# Patient Record
Sex: Female | Born: 1975 | Race: White | Hispanic: No | Marital: Married | Smoking: Current every day smoker
Health system: Southern US, Community
[De-identification: ages and names within clinical notes are randomized; demographics above are authoritative.]

## PROBLEM LIST (undated history)

## (undated) DIAGNOSIS — K219 Gastro-esophageal reflux disease without esophagitis: Secondary | ICD-10-CM

## (undated) DIAGNOSIS — E119 Type 2 diabetes mellitus without complications: Secondary | ICD-10-CM

## (undated) DIAGNOSIS — R351 Nocturia: Secondary | ICD-10-CM

## (undated) DIAGNOSIS — Z803 Family history of malignant neoplasm of breast: Secondary | ICD-10-CM

## (undated) DIAGNOSIS — R350 Frequency of micturition: Secondary | ICD-10-CM

## (undated) DIAGNOSIS — Z8041 Family history of malignant neoplasm of ovary: Secondary | ICD-10-CM

## (undated) DIAGNOSIS — R102 Pelvic and perineal pain: Secondary | ICD-10-CM

## (undated) DIAGNOSIS — C439 Malignant melanoma of skin, unspecified: Secondary | ICD-10-CM

## (undated) DIAGNOSIS — R3915 Urgency of urination: Secondary | ICD-10-CM

## (undated) DIAGNOSIS — Z8639 Personal history of other endocrine, nutritional and metabolic disease: Secondary | ICD-10-CM

## (undated) DIAGNOSIS — R5383 Other fatigue: Secondary | ICD-10-CM

## (undated) HISTORY — DX: Other fatigue: R53.83

## (undated) HISTORY — DX: Gastro-esophageal reflux disease without esophagitis: K21.9

## (undated) HISTORY — DX: Family history of malignant neoplasm of ovary: Z80.41

## (undated) HISTORY — DX: Malignant melanoma of skin, unspecified: C43.9

## (undated) HISTORY — DX: Type 2 diabetes mellitus without complications: E11.9

## (undated) HISTORY — DX: Family history of malignant neoplasm of breast: Z80.3

---

## 1996-02-24 HISTORY — PX: WISDOM TOOTH EXTRACTION: SHX21

## 1999-02-24 HISTORY — PX: KNEE SURGERY: SHX244

## 2002-02-23 HISTORY — PX: PELVIC LAPAROSCOPY: SHX162

## 2003-12-05 ENCOUNTER — Inpatient Hospital Stay: Payer: Self-pay

## 2006-07-16 ENCOUNTER — Ambulatory Visit: Payer: Self-pay | Admitting: Gastroenterology

## 2006-12-28 ENCOUNTER — Ambulatory Visit: Payer: Self-pay

## 2006-12-28 IMAGING — US ULTRASOUND LEFT BREAST
1 series · 13 of 13 positions shown · non-contrast
Comparison: none

REASON FOR EXAM: left fibrocystic tissue upper outer quad at 1 to 3
oclock location
COMMENTS:

PROCEDURE:     US  - US BREAST LEFT  - [DATE]  [DATE]
RESULT:     Limited sonographic evaluation of the LEFT breast is performed
from the 1 o'clock to the 3 o'clock position. There is no definite solid or
cystic abnormality evident sonographically.

[Series 1: ultrasound left breast · 13 of 13 slices shown]
[im 1/13]
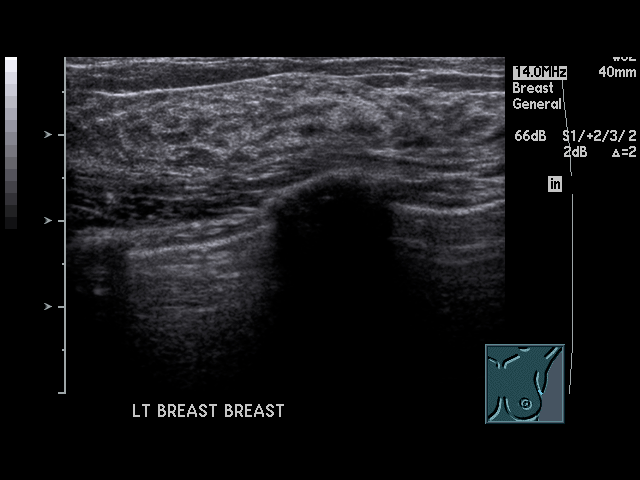
[im 2/13]
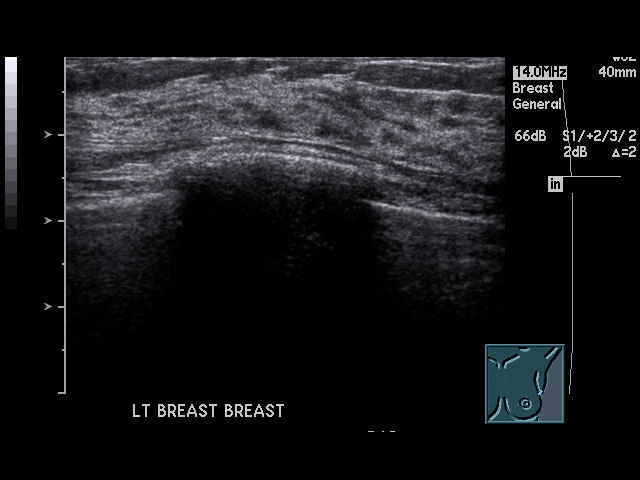
[im 3/13]
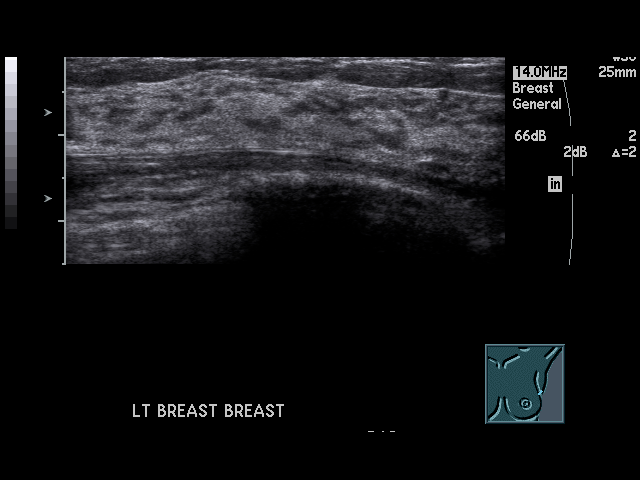
[im 4/13]
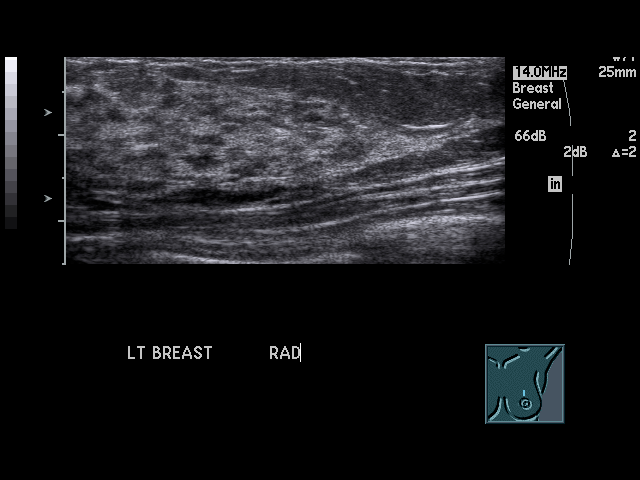
[im 5/13]
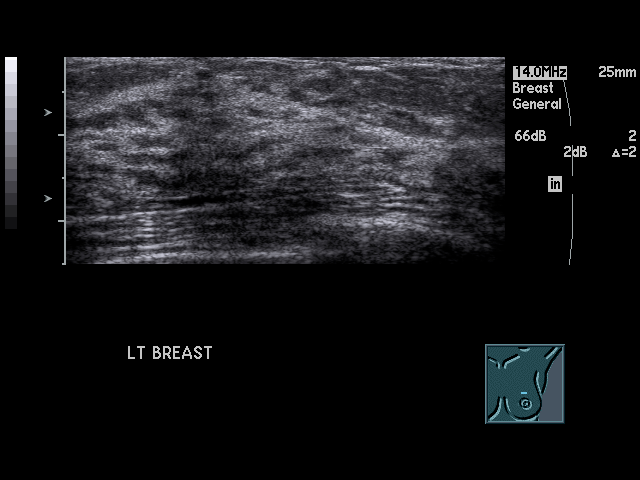
[im 6/13]
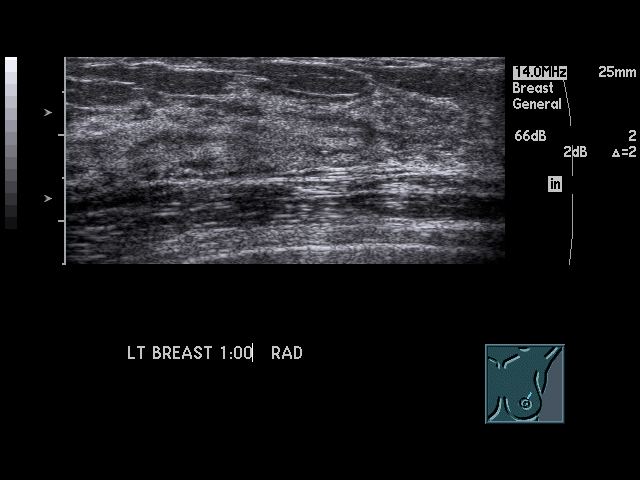
[im 7/13]
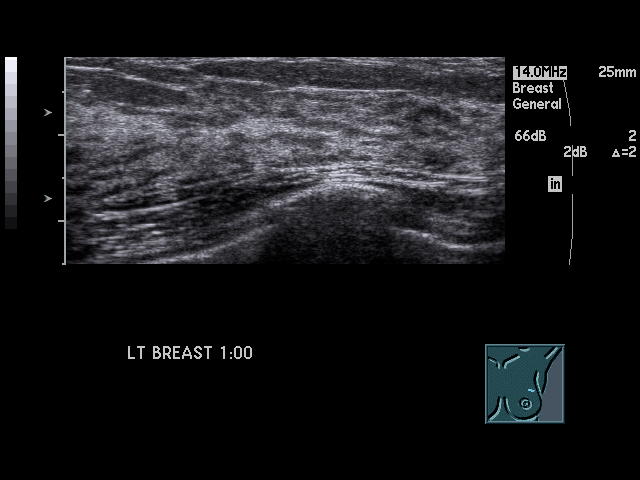
[im 8/13]
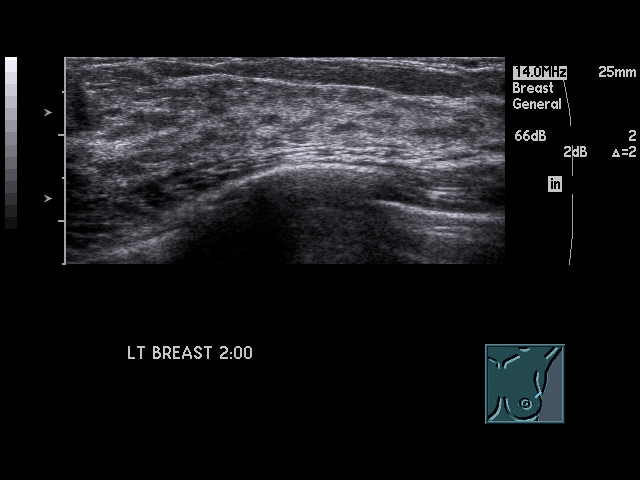
[im 9/13]
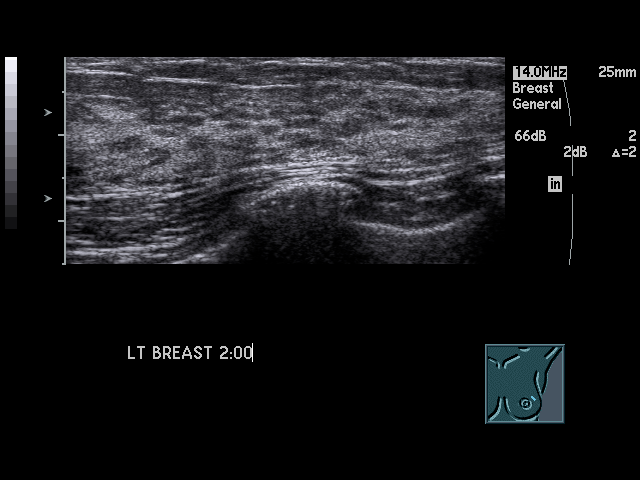
[im 10/13]
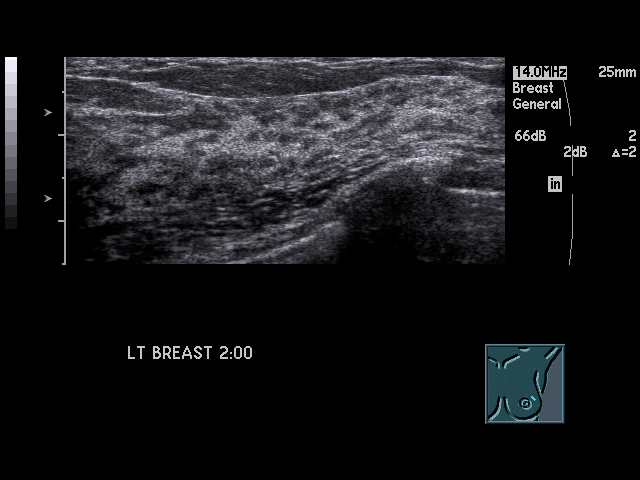
[im 11/13]
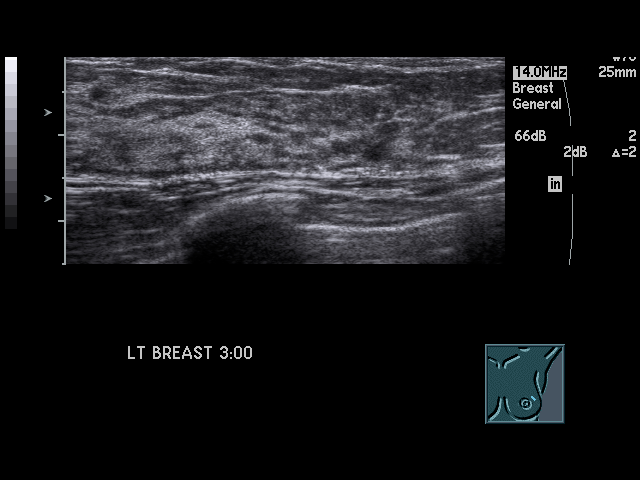
[im 12/13]
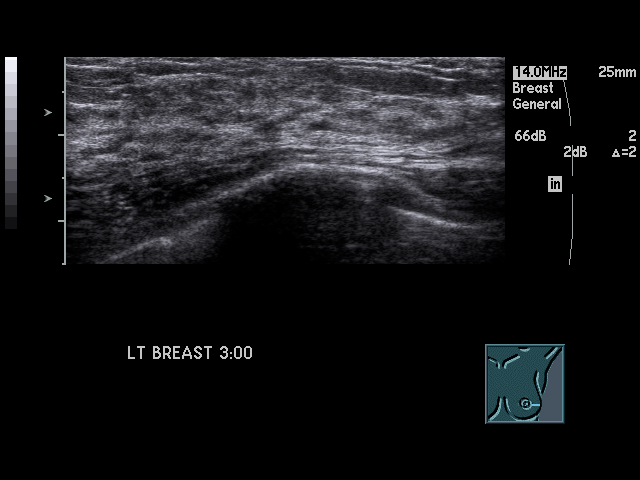
[im 13/13]
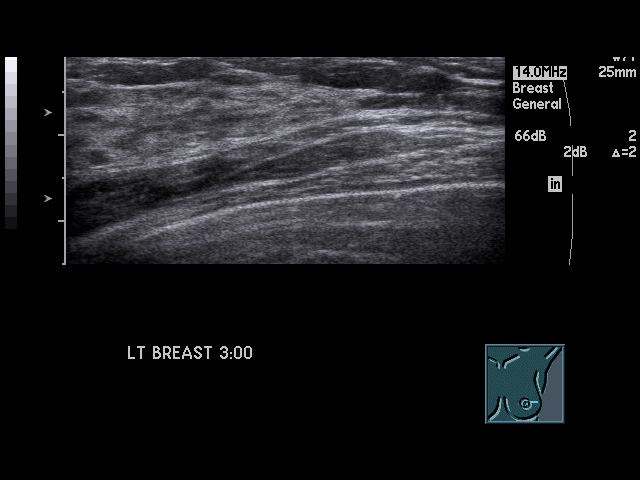

[13 of 13 positions shown; findings below may reference images not displayed]

IMPRESSION: Negative limited LEFT breast ultrasound. If the patient has
clinically worrisome findings then mammography would be recommended.

BI-RADS: Category 2 - Benign Finding.

## 2007-03-09 ENCOUNTER — Ambulatory Visit: Payer: Self-pay

## 2008-02-24 HISTORY — PX: CHOLECYSTECTOMY: SHX55

## 2008-03-12 ENCOUNTER — Emergency Department: Payer: Self-pay | Admitting: Emergency Medicine

## 2008-03-19 ENCOUNTER — Ambulatory Visit: Payer: Self-pay | Admitting: Gastroenterology

## 2008-04-06 ENCOUNTER — Ambulatory Visit: Payer: Self-pay | Admitting: Surgery

## 2008-11-07 ENCOUNTER — Ambulatory Visit: Payer: Self-pay | Admitting: Obstetrics & Gynecology

## 2008-12-13 ENCOUNTER — Ambulatory Visit: Payer: Self-pay | Admitting: Surgery

## 2008-12-27 ENCOUNTER — Ambulatory Visit: Payer: Self-pay | Admitting: Obstetrics & Gynecology

## 2009-03-21 ENCOUNTER — Ambulatory Visit: Payer: Self-pay | Admitting: Obstetrics & Gynecology

## 2009-06-10 ENCOUNTER — Ambulatory Visit: Payer: Self-pay | Admitting: Obstetrics and Gynecology

## 2009-07-11 ENCOUNTER — Encounter: Payer: Self-pay | Admitting: Obstetrics & Gynecology

## 2009-07-11 ENCOUNTER — Ambulatory Visit: Payer: Self-pay | Admitting: Obstetrics & Gynecology

## 2009-07-11 LAB — CONVERTED CEMR LAB
Glucose, Bld: 58 mg/dL — ABNORMAL LOW (ref 70–99)
Platelets: 351 10*3/uL (ref 150–400)
RDW: 13.9 % (ref 11.5–15.5)
TSH: 1.12 microintl units/mL (ref 0.350–4.500)

## 2009-09-09 ENCOUNTER — Ambulatory Visit: Payer: Self-pay | Admitting: Obstetrics and Gynecology

## 2009-11-22 ENCOUNTER — Ambulatory Visit: Payer: Self-pay | Admitting: Obstetrics & Gynecology

## 2010-02-13 ENCOUNTER — Ambulatory Visit: Payer: Self-pay | Admitting: Obstetrics & Gynecology

## 2010-04-07 ENCOUNTER — Ambulatory Visit: Payer: Self-pay | Admitting: Obstetrics & Gynecology

## 2010-05-05 ENCOUNTER — Ambulatory Visit (INDEPENDENT_AMBULATORY_CARE_PROVIDER_SITE_OTHER): Payer: BC Managed Care – PPO

## 2010-05-05 ENCOUNTER — Ambulatory Visit: Payer: BC Managed Care – PPO | Admitting: Obstetrics & Gynecology

## 2010-05-05 DIAGNOSIS — Z3049 Encounter for surveillance of other contraceptives: Secondary | ICD-10-CM

## 2010-05-05 DIAGNOSIS — N9489 Other specified conditions associated with female genital organs and menstrual cycle: Secondary | ICD-10-CM

## 2010-05-06 ENCOUNTER — Encounter: Payer: Self-pay | Admitting: Obstetrics and Gynecology

## 2010-05-16 NOTE — Assessment & Plan Note (Unsigned)
NAME:  [name], [name]              ACCOUNT NO.:  [id number]  MEDICAL RECORD NO.:  [id number]           PATIENT TYPE:  LOCATION:  [facility] at [facility]           FACILITY:  PHYSICIAN:  Argentina Donovan, MD        DATE OF BIRTH:  [birth year]  DATE OF SERVICE:  05/05/2010                                 CLINIC NOTE  The patient is a 35 year old married white female, gravida 2, para 1-0-1- 1 who was in for her annual exam in May of last year.  She comes in today because she has pain in her left breast and she is complaining of some sores in the vaginal area. On examination, her breasts are symmetrical.  No dominant masses.  No nipple discharge.  No supraclavicular or axillary nodes.  However, there is a very sensitive area intercostally in the left lateral flank area just under her axilla, but she mentions that she has to very often take off her bra.  I think that this is possibly is due to trauma.  I would suggest that she use a wider band bra such as an athletic bra and apply capsaicin HP ointment or cream as well as using ibuprofen and see if that takes care of it. On examination of the genital area, she has some small ulcerations just above the clitoris, but she states that they have been there for 2 months which does not sound like herpes, although this is what they look like.  They look like herpetic ulcers and also culture was taken.  I am going to put her on Valtrex to see whether that takes care of it.  I am not exactly sure what this is from.  It is in the hair-bearing area and they look like little fissures.  I am not sure what the etiology is at this point.  I am going to have her come back in a couple of weeks after taking antiviral medication and see if there is any improvement.  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          ______________________________ Argentina Donovan, MD    PR/MEDQ  D:  05/05/2010  T:  05/06/2010  Job:  [id number]

## 2010-05-19 ENCOUNTER — Ambulatory Visit: Payer: BC Managed Care – PPO | Admitting: Obstetrics & Gynecology

## 2010-05-22 ENCOUNTER — Other Ambulatory Visit: Payer: Self-pay | Admitting: Obstetrics & Gynecology

## 2010-05-22 ENCOUNTER — Ambulatory Visit (INDEPENDENT_AMBULATORY_CARE_PROVIDER_SITE_OTHER): Payer: BC Managed Care – PPO | Admitting: Obstetrics & Gynecology

## 2010-05-22 DIAGNOSIS — N644 Mastodynia: Secondary | ICD-10-CM

## 2010-05-22 DIAGNOSIS — D4959 Neoplasm of unspecified behavior of other genitourinary organ: Secondary | ICD-10-CM

## 2010-05-23 ENCOUNTER — Other Ambulatory Visit: Payer: Self-pay | Admitting: Obstetrics & Gynecology

## 2010-05-23 ENCOUNTER — Ambulatory Visit
Admission: RE | Admit: 2010-05-23 | Discharge: 2010-05-23 | Disposition: A | Discharge status: Home / Self Care | Payer: BC Managed Care – PPO | Source: Ambulatory Visit | Attending: Obstetrics & Gynecology | Admitting: Obstetrics & Gynecology

## 2010-05-23 DIAGNOSIS — N63 Unspecified lump in unspecified breast: Secondary | ICD-10-CM

## 2010-05-23 DIAGNOSIS — N644 Mastodynia: Secondary | ICD-10-CM

## 2010-06-09 ENCOUNTER — Ambulatory Visit: Payer: BC Managed Care – PPO | Admitting: Obstetrics & Gynecology
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
## 2010-07-08 NOTE — Assessment & Plan Note (Signed)
NAME:  [name], [name]              ACCOUNT NO.:  [id number]   MEDICAL RECORD NO.:  [id number]          PATIENT TYPE:  POB   LOCATION:  [facility]         FACILITY:  [facility]   PHYSICIAN:  Allie Bossier, MD        DATE OF BIRTH:  07-Apr-1975   DATE OF SERVICE:  07/11/2009                                  CLINIC NOTE   HISTORY:  Galina is a 35 year old married white, gravida 2, para 1,  abortus 1, and she has a 52-year-old daughter.  She comes in here for  annual exam.  She reports that she thinks that she might have had  another ovarian cyst on her left.  She had left-sided pain last week, it  has improved over the last several days as she has been taking  ibuprofen.  She says now it is dull ache.   PAST MEDICAL HISTORY:  Recurrent ovarian cyst, history of PID,  Trichomonas, and herpes, history of breast cysts and GERD.   REVIEW OF SYSTEMS:  She had been married for 8 years.  She works at  [employer].  She denies dyspareunia.  She rarely has periods, on  the Depo-Provera.  Of note, she does complain of 20-pound weight gain in  the last year.   MEDICATIONS:  She takes Depo-Provera every 3 months, takes olive leaves  extract and ibuprofen as necessary and Gas-X as necessary.   PAST SURGICAL HISTORY:  She had a cholecystectomy in October 2010.  Laparoscopy at age 29 with a left ovarian cystectomy.  She had left  arthroscopy.  She has had endoscopy with biopsies in 2008.   ALLERGIES:  No latex allergies.  No known drug allergies.   FAMILY HISTORY:  Significant for cervical, ovarian melanoma, and bladder  cancer in her mother and a paternal aunt who had breast cancer.   PHYSICAL EXAMINATION:  VITAL SIGNS:  Height 5 feet 2 inches, weight 127  pounds, blood pressure 111/72, and pulse 94.  HEENT:  Normal.  HEART:  Regular rate and rhythm.  LUNGS:  Clear to auscultation bilaterally.  BREASTS:  Normal.  ABDOMEN:  No palpable hepatosplenomegaly.  EXTERNAL GENITALIA:  No  lesions.  Cervix, normal.  Uterus normal size  and shape, midplane, mobile.  Adnexa, nontender.  No masses.   ASSESSMENT AND PLAN:  1. Annual exam, checked Pap smear with cultures.  Recommended self-      breast and self-vulvar exams.  2. Left lower quadrant discomfort.  I offered her an ultrasound, but      told her that since her pain is getting better, perhaps the      ultrasound would be over until she agrees with this, but I have      told her that should her pain return we will get a GYN ultrasound.  3. Weight gain maybe due to Depo-Provera, but she also complains of      constipation and dry skins and I am checking a TSH.  4. She says she has a history of low blood sugar and she would like      her lipids and sugar checked today, so this will be done.  Allie Bossier, MD     MCD/MEDQ  D:  07/11/2009  T:  07/12/2009  Job:  [id number]
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
## 2010-07-17 ENCOUNTER — Ambulatory Visit (INDEPENDENT_AMBULATORY_CARE_PROVIDER_SITE_OTHER): Payer: BC Managed Care – PPO | Admitting: Obstetrics & Gynecology

## 2010-07-17 DIAGNOSIS — N766 Ulceration of vulva: Secondary | ICD-10-CM

## 2010-07-17 DIAGNOSIS — Z113 Encounter for screening for infections with a predominantly sexual mode of transmission: Secondary | ICD-10-CM

## 2010-07-17 DIAGNOSIS — R5381 Other malaise: Secondary | ICD-10-CM

## 2010-07-17 DIAGNOSIS — Z1272 Encounter for screening for malignant neoplasm of vagina: Secondary | ICD-10-CM

## 2010-07-17 DIAGNOSIS — E162 Hypoglycemia, unspecified: Secondary | ICD-10-CM

## 2010-07-17 DIAGNOSIS — R350 Frequency of micturition: Secondary | ICD-10-CM

## 2010-07-17 DIAGNOSIS — Z01419 Encounter for gynecological examination (general) (routine) without abnormal findings: Secondary | ICD-10-CM
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
## 2010-08-04 ENCOUNTER — Ambulatory Visit: Payer: BC Managed Care – PPO

## 2010-08-05 ENCOUNTER — Ambulatory Visit: Payer: BC Managed Care – PPO

## 2010-08-07 ENCOUNTER — Ambulatory Visit (INDEPENDENT_AMBULATORY_CARE_PROVIDER_SITE_OTHER): Payer: BC Managed Care – PPO | Admitting: Obstetrics & Gynecology

## 2010-08-07 ENCOUNTER — Ambulatory Visit: Payer: BC Managed Care – PPO

## 2010-08-07 DIAGNOSIS — Z3049 Encounter for surveillance of other contraceptives: Secondary | ICD-10-CM

## 2010-09-04 NOTE — Assessment & Plan Note (Signed)
NAME:  [name], [name]              ACCOUNT NO.:  [id number]  MEDICAL RECORD NO.:  [id number]          PATIENT TYPE:  POB  LOCATION:  [facility]         FACILITY:  [facility]  PHYSICIAN:  Elsie Lincoln, MD      DATE OF BIRTH:  Sep 12, 1975  DATE OF SERVICE:  05/22/2010                                 CLINIC NOTE  The patient is a 35 year old female who presents for multiple reasons. 1. The patient has left breast pain.  She has a history of breast     cancer and is very worried.  The left breast is bigger than the     right breast and it has not gone away for several weeks.  She     changed bras and taken ibuprofen, it was not helping.  She has had     breast cancer in her aunt and again that she is very worried, so I     suggest [facility] and if they     need to do any other diagnostic workup, I will leave up to them. 2. The patient is complaining of periodic pustules coming up in her     perineum.  Upon inspection, these look like MRSA, but one of them     is coming up and once the top of the ulceration was removed, it     does not necessary look like a pustule with pus, it looks more     ulcerative, so I am not quite sure if this is MRSA or bacterial.     It could be a viral or another type of dermatological problem.     These have been coming up periodically for a long time.  I cultured     the blister that I unroofed with a bacterial culture and we will     treat with Bactrim double strength 1 tablet p.o. for 10 days.  The     patient had a negative herpes viral culture and these do not really     look like herpes sores.  Negative herpes viral culture was just     last week.  There is a separate area at the top of her mons just     above her clitoris that has been bothering her for 2 months, it     scabs and weeps and is painful.  This is the area that was negative     for herpes.  Currently, it is crusted over with a yellowish crust.     It  is very red and indurated.  This looks different than the 2     areas that was more pustule like.  I am worried this could be a     precancerous lesion given that it is there for 2 months and it will     not heal.  The patient was consented for biopsy.  This area was     cleaned with Betadine, infiltrated with 1% lidocaine, and a Keyes     punch biopsy was obtained without issue.  Hemostasis was controlled     with silver nitrate.  ASSESSMENT AND PLAN:  A 35 year old female with  multiple problems: 1. Breast issues, will come back, will get a breast ultrasound and     followup accordingly. 2. Bactrim for pustules in the hairline. 3. Biopsy of the area at 12 o'clock on the mons just above the     clitoris.  This area seems different than the other pustule. 4. The patient to come back in 2 weeks to see how she is doing.  I     will not be here and the patient has seen Dr. Marice Potter in the past and     was satisfied to see her.          ______________________________ Elsie Lincoln, MD    KL/MEDQ  D:  05/22/2010  T:  05/23/2010  Job:  [id number]

## 2010-10-30 ENCOUNTER — Ambulatory Visit: Payer: BC Managed Care – PPO

## 2010-11-03 ENCOUNTER — Ambulatory Visit (INDEPENDENT_AMBULATORY_CARE_PROVIDER_SITE_OTHER): Payer: BC Managed Care – PPO | Admitting: *Deleted

## 2010-11-03 DIAGNOSIS — Z3049 Encounter for surveillance of other contraceptives: Secondary | ICD-10-CM

## 2010-11-03 DIAGNOSIS — Z3042 Encounter for surveillance of injectable contraceptive: Secondary | ICD-10-CM

## 2010-11-03 MED ORDER — MEDROXYPROGESTERONE ACETATE 150 MG/ML IM SUSP
150.0000 mg | Freq: Once | INTRAMUSCULAR | Status: AC
  Administered 2010-11-03: 150 mg via INTRAMUSCULAR

## 2010-11-03 NOTE — Progress Notes (Signed)
Patient is here for Depo Provera 150mg .  She will follow up in 3 months for Next injection (December 5)

## 2010-12-04 ENCOUNTER — Telehealth: Payer: Self-pay | Admitting: *Deleted

## 2010-12-04 DIAGNOSIS — B373 Candidiasis of vulva and vagina: Secondary | ICD-10-CM

## 2010-12-04 MED ORDER — FLUCONAZOLE 150 MG PO TABS: 150.0000 mg | ORAL_TABLET | Freq: Once | ORAL | Status: AC

## 2010-12-04 NOTE — Telephone Encounter (Signed)
Patient has a yeast infection and would like Diflucan called in for her.

## 2011-01-28 ENCOUNTER — Other Ambulatory Visit (INDEPENDENT_AMBULATORY_CARE_PROVIDER_SITE_OTHER): Payer: BC Managed Care – PPO | Admitting: *Deleted

## 2011-01-28 DIAGNOSIS — Z3049 Encounter for surveillance of other contraceptives: Secondary | ICD-10-CM

## 2011-01-28 DIAGNOSIS — Z3042 Encounter for surveillance of injectable contraceptive: Secondary | ICD-10-CM

## 2011-01-28 MED ORDER — MEDROXYPROGESTERONE ACETATE 150 MG/ML IM SUSP
150.0000 mg | Freq: Once | INTRAMUSCULAR | Status: AC
  Administered 2011-01-28: 150 mg via INTRAMUSCULAR

## 2011-04-08 ENCOUNTER — Encounter: Payer: Self-pay | Admitting: Cardiothoracic Surgery

## 2011-04-08 ENCOUNTER — Encounter: Payer: Self-pay | Admitting: Nurse Practitioner

## 2011-04-23 ENCOUNTER — Ambulatory Visit (INDEPENDENT_AMBULATORY_CARE_PROVIDER_SITE_OTHER): Payer: BC Managed Care – PPO | Admitting: *Deleted

## 2011-04-23 DIAGNOSIS — Z3049 Encounter for surveillance of other contraceptives: Secondary | ICD-10-CM

## 2011-04-23 MED ORDER — MEDROXYPROGESTERONE ACETATE 150 MG/ML IM SUSP
150.0000 mg | INTRAMUSCULAR | Status: DC
  Administered 2011-04-23 – 2011-10-13 (×3): 150 mg via INTRAMUSCULAR

## 2011-04-23 NOTE — Progress Notes (Signed)
Patient is here for Depo Provera.  Her last injection was given in December and the computers were down so this is why there is no documentation.  She will return in April for her physical  Exam.

## 2011-04-24 ENCOUNTER — Encounter: Payer: Self-pay | Admitting: Cardiothoracic Surgery

## 2011-04-24 ENCOUNTER — Encounter: Payer: Self-pay | Admitting: Nurse Practitioner

## 2011-06-18 ENCOUNTER — Ambulatory Visit: Payer: BC Managed Care – PPO | Admitting: Family Medicine

## 2011-06-24 ENCOUNTER — Telehealth: Payer: Self-pay | Admitting: *Deleted

## 2011-06-24 MED ORDER — FLUCONAZOLE 150 MG PO TABS: 150.0000 mg | ORAL_TABLET | Freq: Once | ORAL | Status: AC

## 2011-06-24 NOTE — Telephone Encounter (Signed)
Patient is having symptoms of a yeast infection and would like to have diflucan called in.  She will call for appointment if she is not feeling better.

## 2011-07-07 ENCOUNTER — Encounter: Payer: Self-pay | Admitting: Family Medicine

## 2011-07-07 ENCOUNTER — Ambulatory Visit (INDEPENDENT_AMBULATORY_CARE_PROVIDER_SITE_OTHER): Payer: BC Managed Care – PPO | Admitting: Family Medicine

## 2011-07-07 VITALS — BP 126/83 | HR 104 | Ht 62.0 in | Wt 140.0 lb

## 2011-07-07 DIAGNOSIS — K219 Gastro-esophageal reflux disease without esophagitis: Secondary | ICD-10-CM | POA: Insufficient documentation

## 2011-07-07 DIAGNOSIS — B999 Unspecified infectious disease: Secondary | ICD-10-CM | POA: Insufficient documentation

## 2011-07-07 DIAGNOSIS — Z124 Encounter for screening for malignant neoplasm of cervix: Secondary | ICD-10-CM

## 2011-07-07 DIAGNOSIS — Z01419 Encounter for gynecological examination (general) (routine) without abnormal findings: Secondary | ICD-10-CM

## 2011-07-07 DIAGNOSIS — Z113 Encounter for screening for infections with a predominantly sexual mode of transmission: Secondary | ICD-10-CM

## 2011-07-07 DIAGNOSIS — J309 Allergic rhinitis, unspecified: Secondary | ICD-10-CM

## 2011-07-07 DIAGNOSIS — Z1151 Encounter for screening for human papillomavirus (HPV): Secondary | ICD-10-CM

## 2011-07-07 NOTE — Progress Notes (Signed)
  Subjective:     [name] is a 36 y.o. female and is here for a comprehensive physical exam. The patient reports problems - weight gain with Depo, cannot have OC's or other like BC because of age and smoking status, does not want an IUD.  Having some marital issues, desires STD check..  History   Social History  . Marital Status: Married    Spouse Name: N/A    Number of Children: N/A  . Years of Education: N/A   Occupational History  . Not on file.   Social History Main Topics  . Smoking status: Current Everyday Smoker -- 0.5 packs/day    Types: Cigarettes  . Smokeless tobacco: Not on file  . Alcohol Use: Yes     2 glass wine nightly.  . Drug Use: Yes  . Sexually Active: Yes -- Female partner(s)   Other Topics Concern  . Not on file   Social History Narrative  . No narrative on file   Health Maintenance  Topic Date Due  . Pap Smear  11/30/1993  . Tetanus/tdap  12/01/1994  . Influenza Vaccine  11/24/2011    The following portions of the patient's history were reviewed and updated as appropriate: allergies, current medications, past family history, past medical history, past social history, past surgical history and problem list.  Review of Systems A comprehensive review of systems was negative.   Objective:    BP 126/83  Pulse 104  Ht 5\' 2"  (1.575 m)  Wt 140 lb (63.504 kg)  BMI 25.61 kg/m2 General appearance: alert, cooperative and appears stated age Head: Normocephalic, without obvious abnormality, atraumatic Neck: no adenopathy, supple, symmetrical, trachea midline and thyroid not enlarged, symmetric, no tenderness/mass/nodules Lungs: clear to auscultation bilaterally Breasts: normal appearance, no masses or tenderness Heart: regular rate and rhythm, S1, S2 normal, no murmur, click, rub or gallop Abdomen: soft, non-tender; bowel sounds normal; no masses,  no organomegaly Pelvic: cervix normal in appearance, external genitalia normal, no adnexal masses or  tenderness, no cervical motion tenderness, uterus normal size, shape, and consistency and vagina normal without discharge Extremities: extremities normal, atraumatic, no cyanosis or edema Pulses: 2+ and symmetric Skin: Skin color, texture, turgor normal. No rashes or lesions Lymph nodes: Cervical, supraclavicular, and axillary nodes normal. Neurologic: Grossly normal    Assessment:    Healthy female exam. STD Check     Plan:  Depo prn Pap smear today Update labs, nml lipids last year. STD check   See After Visit Summary for Counseling Recommendations

## 2011-07-07 NOTE — Patient Instructions (Signed)
Preventive Care for Adults, Female A healthy lifestyle and preventive care can promote health and wellness. Preventive health guidelines for women include the following key practices.  A routine yearly physical is a good way to check with your caregiver about your health and preventive screening. It is a chance to share any concerns and updates on your health, and to receive a thorough exam.   Visit your dentist for a routine exam and preventive care every 6 months. Brush your teeth twice a day and floss once a day. Good oral hygiene prevents tooth decay and gum disease.   The frequency of eye exams is based on your age, health, family medical history, use of contact lenses, and other factors. Follow your caregiver's recommendations for frequency of eye exams.   Eat a healthy diet. Foods like vegetables, fruits, whole grains, low-fat dairy products, and lean protein foods contain the nutrients you need without too many calories. Decrease your intake of foods high in solid fats, added sugars, and salt. Eat the right amount of calories for you.Get information about a proper diet from your caregiver, if necessary.   Regular physical exercise is one of the most important things you can do for your health. Most adults should get at least 150 minutes of moderate-intensity exercise (any activity that increases your heart rate and causes you to sweat) each week. In addition, most adults need muscle-strengthening exercises on 2 or more days a week.   Maintain a healthy weight. The body mass index (BMI) is a screening tool to identify possible weight problems. It provides an estimate of body fat based on height and weight. Your caregiver can help determine your BMI, and can help you achieve or maintain a healthy weight.For adults 20 years and older:   A BMI below 18.5 is considered underweight.   A BMI of 18.5 to 24.9 is normal.   A BMI of 25 to 29.9 is considered overweight.   A BMI of 30 and above is  considered obese.   Maintain normal blood lipids and cholesterol levels by exercising and minimizing your intake of saturated fat. Eat a balanced diet with plenty of fruit and vegetables. Blood tests for lipids and cholesterol should begin at age 20 and be repeated every 5 years. If your lipid or cholesterol levels are high, you are over 50, or you are at high risk for heart disease, you may need your cholesterol levels checked more frequently.Ongoing high lipid and cholesterol levels should be treated with medicines if diet and exercise are not effective.   If you smoke, find out from your caregiver how to quit. If you do not use tobacco, do not start.   If you are pregnant, do not drink alcohol. If you are breastfeeding, be very cautious about drinking alcohol. If you are not pregnant and choose to drink alcohol, do not exceed 1 drink per day. One drink is considered to be 12 ounces (355 mL) of beer, 5 ounces (148 mL) of wine, or 1.5 ounces (44 mL) of liquor.   Avoid use of street drugs. Do not share needles with anyone. Ask for help if you need support or instructions about stopping the use of drugs.   High blood pressure causes heart disease and increases the risk of stroke. Your blood pressure should be checked at least every 1 to 2 years. Ongoing high blood pressure should be treated with medicines if weight loss and exercise are not effective.   If you are 55 to 36   years old, ask your caregiver if you should take aspirin to prevent strokes.   Diabetes screening involves taking a blood sample to check your fasting blood sugar level. This should be done once every 3 years, after age 45, if you are within normal weight and without risk factors for diabetes. Testing should be considered at a younger age or be carried out more frequently if you are overweight and have at least 1 risk factor for diabetes.   Breast cancer screening is essential preventive care for women. You should practice "breast  self-awareness." This means understanding the normal appearance and feel of your breasts and may include breast self-examination. Any changes detected, no matter how small, should be reported to a caregiver. Women in their 20s and 30s should have a clinical breast exam (CBE) by a caregiver as part of a regular health exam every 1 to 3 years. After age 40, women should have a CBE every year. Starting at age 40, women should consider having a mammography (breast X-ray test) every year. Women who have a family history of breast cancer should talk to their caregiver about genetic screening. Women at a high risk of breast cancer should talk to their caregivers about having magnetic resonance imaging (MRI) and a mammography every year.   The Pap test is a screening test for cervical cancer. A Pap test can show cell changes on the cervix that might become cervical cancer if left untreated. A Pap test is a procedure in which cells are obtained and examined from the lower end of the uterus (cervix).   Women should have a Pap test starting at age 21.   Between ages 21 and 29, Pap tests should be repeated every 2 years.   Beginning at age 30, you should have a Pap test every 3 years as long as the past 3 Pap tests have been normal.   Some women have medical problems that increase the chance of getting cervical cancer. Talk to your caregiver about these problems. It is especially important to talk to your caregiver if a new problem develops soon after your last Pap test. In these cases, your caregiver may recommend more frequent screening and Pap tests.   The above recommendations are the same for women who have or have not gotten the vaccine for human papillomavirus (HPV).   If you had a hysterectomy for a problem that was not cancer or a condition that could lead to cancer, then you no longer need Pap tests. Even if you no longer need a Pap test, a regular exam is a good idea to make sure no other problems are  starting.   If you are between ages 65 and 70, and you have had normal Pap tests going back 10 years, you no longer need Pap tests. Even if you no longer need a Pap test, a regular exam is a good idea to make sure no other problems are starting.   If you have had past treatment for cervical cancer or a condition that could lead to cancer, you need Pap tests and screening for cancer for at least 20 years after your treatment.   If Pap tests have been discontinued, risk factors (such as a new sexual partner) need to be reassessed to determine if screening should be resumed.   The HPV test is an additional test that may be used for cervical cancer screening. The HPV test looks for the virus that can cause the cell changes on the cervix.   The cells collected during the Pap test can be tested for HPV. The HPV test could be used to screen women aged 30 years and older, and should be used in women of any age who have unclear Pap test results. After the age of 30, women should have HPV testing at the same frequency as a Pap test.   Colorectal cancer can be detected and often prevented. Most routine colorectal cancer screening begins at the age of 50 and continues through age 75. However, your caregiver may recommend screening at an earlier age if you have risk factors for colon cancer. On a yearly basis, your caregiver may provide home test kits to check for hidden blood in the stool. Use of a small camera at the end of a tube, to directly examine the colon (sigmoidoscopy or colonoscopy), can detect the earliest forms of colorectal cancer. Talk to your caregiver about this at age 50, when routine screening begins. Direct examination of the colon should be repeated every 5 to 10 years through age 75, unless early forms of pre-cancerous polyps or small growths are found.   Hepatitis C blood testing is recommended for all people born from 1945 through 1965 and any individual with known risks for hepatitis C.    Practice safe sex. Use condoms and avoid high-risk sexual practices to reduce the spread of sexually transmitted infections (STIs). STIs include gonorrhea, chlamydia, syphilis, trichomonas, herpes, HPV, and human immunodeficiency virus (HIV). Herpes, HIV, and HPV are viral illnesses that have no cure. They can result in disability, cancer, and death. Sexually active women aged 25 and younger should be checked for chlamydia. Older women with new or multiple partners should also be tested for chlamydia. Testing for other STIs is recommended if you are sexually active and at increased risk.   Osteoporosis is a disease in which the bones lose minerals and strength with aging. This can result in serious bone fractures. The risk of osteoporosis can be identified using a bone density scan. Women ages 65 and over and women at risk for fractures or osteoporosis should discuss screening with their caregivers. Ask your caregiver whether you should take a calcium supplement or vitamin D to reduce the rate of osteoporosis.   Menopause can be associated with physical symptoms and risks. Hormone replacement therapy is available to decrease symptoms and risks. You should talk to your caregiver about whether hormone replacement therapy is right for you.   Use sunscreen with sun protection factor (SPF) of 30 or more. Apply sunscreen liberally and repeatedly throughout the day. You should seek shade when your shadow is shorter than you. Protect yourself by wearing long sleeves, pants, a wide-brimmed hat, and sunglasses year round, whenever you are outdoors.   Once a month, do a whole body skin exam, using a mirror to look at the skin on your back. Notify your caregiver of new moles, moles that have irregular borders, moles that are larger than a pencil eraser, or moles that have changed in shape or color.   Stay current with required immunizations.   Influenza. You need a dose every fall (or winter). The composition of  the flu vaccine changes each year, so being vaccinated once is not enough.   Pneumococcal polysaccharide. You need 1 to 2 doses if you smoke cigarettes or if you have certain chronic medical conditions. You need 1 dose at age 65 (or older) if you have never been vaccinated.   Tetanus, diphtheria, pertussis (Tdap, Td). Get 1 dose of   Tdap vaccine if you are younger than age 65, are over 65 and have contact with an infant, are a healthcare worker, are pregnant, or simply want to be protected from whooping cough. After that, you need a Td booster dose every 10 years. Consult your caregiver if you have not had at least 3 tetanus and diphtheria-containing shots sometime in your life or have a deep or dirty wound.   HPV. You need this vaccine if you are a woman age 26 or younger. The vaccine is given in 3 doses over 6 months.   Measles, mumps, rubella (MMR). You need at least 1 dose of MMR if you were born in 1957 or later. You may also need a second dose.   Meningococcal. If you are age 19 to 21 and a first-year college student living in a residence hall, or have one of several medical conditions, you need to get vaccinated against meningococcal disease. You may also need additional booster doses.   Zoster (shingles). If you are age 60 or older, you should get this vaccine.   Varicella (chickenpox). If you have never had chickenpox or you were vaccinated but received only 1 dose, talk to your caregiver to find out if you need this vaccine.   Hepatitis A. You need this vaccine if you have a specific risk factor for hepatitis A virus infection or you simply wish to be protected from this disease. The vaccine is usually given as 2 doses, 6 to 18 months apart.   Hepatitis B. You need this vaccine if you have a specific risk factor for hepatitis B virus infection or you simply wish to be protected from this disease. The vaccine is given in 3 doses, usually over 6 months.  Preventive Services /  Frequency Ages 19 to 39  Blood pressure check.** / Every 1 to 2 years.   Lipid and cholesterol check.** / Every 5 years beginning at age 20.   Clinical breast exam.** / Every 3 years for women in their 20s and 30s.   Pap test.** / Every 2 years from ages 21 through 29. Every 3 years starting at age 30 through age 65 or 70 with a history of 3 consecutive normal Pap tests.   HPV screening.** / Every 3 years from ages 30 through ages 65 to 70 with a history of 3 consecutive normal Pap tests.   Hepatitis C blood test.** / For any individual with known risks for hepatitis C.   Skin self-exam. / Monthly.   Influenza immunization.** / Every year.   Pneumococcal polysaccharide immunization.** / 1 to 2 doses if you smoke cigarettes or if you have certain chronic medical conditions.   Tetanus, diphtheria, pertussis (Tdap, Td) immunization. / A one-time dose of Tdap vaccine. After that, you need a Td booster dose every 10 years.   HPV immunization. / 3 doses over 6 months, if you are 26 and younger.   Measles, mumps, rubella (MMR) immunization. / You need at least 1 dose of MMR if you were born in 1957 or later. You may also need a second dose.   Meningococcal immunization. / 1 dose if you are age 19 to 21 and a first-year college student living in a residence hall, or have one of several medical conditions, you need to get vaccinated against meningococcal disease. You may also need additional booster doses.   Varicella immunization.** / Consult your caregiver.   Hepatitis A immunization.** / Consult your caregiver. 2 doses, 6 to 18 months   apart.   Hepatitis B immunization.** / Consult your caregiver. 3 doses usually over 6 months.  Ages 40 to 64  Blood pressure check.** / Every 1 to 2 years.   Lipid and cholesterol check.** / Every 5 years beginning at age 20.   Clinical breast exam.** / Every year after age 40.   Mammogram.** / Every year beginning at age 40 and continuing for as  long as you are in good health. Consult with your caregiver.   Pap test.** / Every 3 years starting at age 30 through age 65 or 70 with a history of 3 consecutive normal Pap tests.   HPV screening.** / Every 3 years from ages 30 through ages 65 to 70 with a history of 3 consecutive normal Pap tests.   Fecal occult blood test (FOBT) of stool. / Every year beginning at age 50 and continuing until age 75. You may not need to do this test if you get a colonoscopy every 10 years.   Flexible sigmoidoscopy or colonoscopy.** / Every 5 years for a flexible sigmoidoscopy or every 10 years for a colonoscopy beginning at age 50 and continuing until age 75.   Hepatitis C blood test.** / For all people born from 1945 through 1965 and any individual with known risks for hepatitis C.   Skin self-exam. / Monthly.   Influenza immunization.** / Every year.   Pneumococcal polysaccharide immunization.** / 1 to 2 doses if you smoke cigarettes or if you have certain chronic medical conditions.   Tetanus, diphtheria, pertussis (Tdap, Td) immunization.** / A one-time dose of Tdap vaccine. After that, you need a Td booster dose every 10 years.   Measles, mumps, rubella (MMR) immunization. / You need at least 1 dose of MMR if you were born in 1957 or later. You may also need a second dose.   Varicella immunization.** / Consult your caregiver.   Meningococcal immunization.** / Consult your caregiver.   Hepatitis A immunization.** / Consult your caregiver. 2 doses, 6 to 18 months apart.   Hepatitis B immunization.** / Consult your caregiver. 3 doses, usually over 6 months.  Ages 65 and over  Blood pressure check.** / Every 1 to 2 years.   Lipid and cholesterol check.** / Every 5 years beginning at age 20.   Clinical breast exam.** / Every year after age 40.   Mammogram.** / Every year beginning at age 40 and continuing for as long as you are in good health. Consult with your caregiver.   Pap test.** /  Every 3 years starting at age 30 through age 65 or 70 with a 3 consecutive normal Pap tests. Testing can be stopped between 65 and 70 with 3 consecutive normal Pap tests and no abnormal Pap or HPV tests in the past 10 years.   HPV screening.** / Every 3 years from ages 30 through ages 65 or 70 with a history of 3 consecutive normal Pap tests. Testing can be stopped between 65 and 70 with 3 consecutive normal Pap tests and no abnormal Pap or HPV tests in the past 10 years.   Fecal occult blood test (FOBT) of stool. / Every year beginning at age 50 and continuing until age 75. You may not need to do this test if you get a colonoscopy every 10 years.   Flexible sigmoidoscopy or colonoscopy.** / Every 5 years for a flexible sigmoidoscopy or every 10 years for a colonoscopy beginning at age 50 and continuing until age 75.   Hepatitis   C blood test.** / For all people born from 1945 through 1965 and any individual with known risks for hepatitis C.   Osteoporosis screening.** / A one-time screening for women ages 65 and over and women at risk for fractures or osteoporosis.   Skin self-exam. / Monthly.   Influenza immunization.** / Every year.   Pneumococcal polysaccharide immunization.** / 1 dose at age 65 (or older) if you have never been vaccinated.   Tetanus, diphtheria, pertussis (Tdap, Td) immunization. / A one-time dose of Tdap vaccine if you are over 65 and have contact with an infant, are a healthcare worker, or simply want to be protected from whooping cough. After that, you need a Td booster dose every 10 years.   Varicella immunization.** / Consult your caregiver.   Meningococcal immunization.** / Consult your caregiver.   Hepatitis A immunization.** / Consult your caregiver. 2 doses, 6 to 18 months apart.   Hepatitis B immunization.** / Check with your caregiver. 3 doses, usually over 6 months.  ** Family history and personal history of risk and conditions may change your caregiver's  recommendations. Document Released: 04/07/2001 Document Revised: 01/29/2011 Document Reviewed: 07/07/2010 [employer] Patient Information 2012 [employer]. 

## 2011-07-08 LAB — CBC
Hemoglobin: 14.4 g/dL (ref 12.0–15.0)
MCHC: 33 g/dL (ref 30.0–36.0)
RBC: 4.46 MIL/uL (ref 3.87–5.11)
WBC: 13 10*3/uL — ABNORMAL HIGH (ref 4.0–10.5)

## 2011-07-08 LAB — TSH: TSH: 1.88 u[IU]/mL (ref 0.350–4.500)

## 2011-07-08 LAB — COMPREHENSIVE METABOLIC PANEL
ALT: 15 U/L (ref 0–35)
CO2: 22 mEq/L (ref 19–32)
Potassium: 3.8 mEq/L (ref 3.5–5.3)
Sodium: 142 mEq/L (ref 135–145)
Total Bilirubin: 0.5 mg/dL (ref 0.3–1.2)
Total Protein: 7.3 g/dL (ref 6.0–8.3)

## 2011-07-16 ENCOUNTER — Ambulatory Visit (INDEPENDENT_AMBULATORY_CARE_PROVIDER_SITE_OTHER): Payer: BC Managed Care – PPO | Admitting: *Deleted

## 2011-07-16 DIAGNOSIS — Z3049 Encounter for surveillance of other contraceptives: Secondary | ICD-10-CM

## 2011-10-13 ENCOUNTER — Other Ambulatory Visit (INDEPENDENT_AMBULATORY_CARE_PROVIDER_SITE_OTHER): Payer: BC Managed Care – PPO

## 2011-10-13 DIAGNOSIS — Z3049 Encounter for surveillance of other contraceptives: Secondary | ICD-10-CM

## 2011-11-02 ENCOUNTER — Encounter: Payer: Self-pay | Admitting: Obstetrics & Gynecology

## 2011-11-02 ENCOUNTER — Ambulatory Visit (INDEPENDENT_AMBULATORY_CARE_PROVIDER_SITE_OTHER): Payer: BC Managed Care – PPO | Admitting: Obstetrics & Gynecology

## 2011-11-02 VITALS — BP 134/93 | HR 99 | Ht 62.0 in | Wt 143.0 lb

## 2011-11-02 DIAGNOSIS — R350 Frequency of micturition: Secondary | ICD-10-CM

## 2011-11-02 NOTE — Progress Notes (Signed)
  Subjective:    Patient ID: [name], female    DOB: September 27, 1975, 36 y.o.   MRN: [id number]  HPI  [name] is a 36 yo MW P1 who is here because she has urinary frequency, nocturia (up to 4 times per night), pain in her bilateral flanks, as well as her fear that she may have bladder cancer (her mother had bladder cancer at the same age). Her fbs was 75 in May 2013. She also complains of vaginal pressure. She denies any urinary incontinence. She reports some occasional dysparunia (rare sex due to marital issues).  Review of Systems     Objective:   Physical Exam Excellent vaginal wall support No masses or tenderness with bimanual exam No flank pain on exam       Assessment & Plan:  Urinary frequency, nocturia- I will check a UA and UC&S. I have given her a CD about IC and have recommended a voiding diary and IC diet. I will refer her to the urologist.

## 2011-11-03 LAB — URINALYSIS
Hgb urine dipstick: NEGATIVE
Leukocytes, UA: NEGATIVE
Nitrite: NEGATIVE
Protein, ur: NEGATIVE mg/dL
Urobilinogen, UA: 1 mg/dL (ref 0.0–1.0)

## 2011-11-04 LAB — URINE CULTURE

## 2012-01-04 ENCOUNTER — Other Ambulatory Visit: Payer: Self-pay | Admitting: Urology

## 2012-01-12 ENCOUNTER — Ambulatory Visit (INDEPENDENT_AMBULATORY_CARE_PROVIDER_SITE_OTHER): Payer: BC Managed Care – PPO | Admitting: *Deleted

## 2012-01-12 DIAGNOSIS — IMO0001 Reserved for inherently not codable concepts without codable children: Secondary | ICD-10-CM

## 2012-01-12 DIAGNOSIS — Z3049 Encounter for surveillance of other contraceptives: Secondary | ICD-10-CM

## 2012-01-12 MED ORDER — MEDROXYPROGESTERONE ACETATE 150 MG/ML IM SUSP
150.0000 mg | Freq: Once | INTRAMUSCULAR | Status: AC
  Administered 2012-01-12: 150 mg via INTRAMUSCULAR

## 2012-01-27 ENCOUNTER — Ambulatory Visit (INDEPENDENT_AMBULATORY_CARE_PROVIDER_SITE_OTHER): Payer: BC Managed Care – PPO | Admitting: Obstetrics & Gynecology

## 2012-01-27 ENCOUNTER — Encounter: Payer: Self-pay | Admitting: Obstetrics & Gynecology

## 2012-01-27 VITALS — BP 138/98 | HR 98 | Ht 62.0 in | Wt 144.0 lb

## 2012-01-27 DIAGNOSIS — IMO0001 Reserved for inherently not codable concepts without codable children: Secondary | ICD-10-CM

## 2012-01-27 DIAGNOSIS — Z309 Encounter for contraceptive management, unspecified: Secondary | ICD-10-CM

## 2012-01-27 NOTE — Progress Notes (Signed)
  Subjective:    Patient ID: [name], female    DOB: Oct 21, 1975, 36 y.o.   MRN: [id number]  HPI  [name] is a 10 yo MW P1 with an 54 yo. She is 100% positive that she does not ever want any more children. She would like to get off the depo provera so that she can get back to her normal weight. Review of Systems She has hot flashes while on the depo provera    Objective:   Physical Exam        Assessment & Plan:  She desires permanent sterility. I have discussed the newest guidelines regarding permanent sterility and the recommendation to do a bilateral salpingectomy. She agrees. She is supposed to have what sounds like a cystoscopy by Dr MacDirmiand in the near future and she would like to combine these procedures. I have sent [place] an email to that effect.

## 2012-02-04 ENCOUNTER — Encounter (HOSPITAL_BASED_OUTPATIENT_CLINIC_OR_DEPARTMENT_OTHER): Admission: RE | Payer: Self-pay | Source: Ambulatory Visit

## 2012-02-04 ENCOUNTER — Ambulatory Visit (HOSPITAL_BASED_OUTPATIENT_CLINIC_OR_DEPARTMENT_OTHER): Admission: RE | Admit: 2012-02-04 | Payer: BC Managed Care – PPO | Source: Ambulatory Visit | Admitting: Urology

## 2012-02-04 SURGERY — CYSTOSCOPY, WITH BLADDER HYDRODISTENSION
Anesthesia: General

## 2012-02-29 ENCOUNTER — Other Ambulatory Visit: Payer: Self-pay | Admitting: Urology

## 2012-03-23 ENCOUNTER — Encounter (HOSPITAL_COMMUNITY): Payer: Self-pay | Admitting: Pharmacist

## 2012-03-31 ENCOUNTER — Inpatient Hospital Stay (HOSPITAL_COMMUNITY): Admission: RE | Admit: 2012-03-31 | Payer: BC Managed Care – PPO | Source: Ambulatory Visit

## 2012-04-04 ENCOUNTER — Encounter (HOSPITAL_BASED_OUTPATIENT_CLINIC_OR_DEPARTMENT_OTHER): Payer: Self-pay | Admitting: *Deleted

## 2012-04-04 NOTE — Progress Notes (Signed)
NPO AFTER MN. ARRIVES AT 1030. NEEDS HG AND URINE PREG. MAY TAKE TYLENOL IF NEEDED AM OF SURG W/ SIP OF WATER.

## 2012-04-04 NOTE — H&P (Signed)
History of Present Illness   I was consulted by Dr. Nicholaus Bloom regarding [name]'s frequency and nocturia. She gets up 3 times a night and voids every 60-90 minutes and sometimes 2 hours due to pressure and discomfort. She has abdominal pressure that is not necessarily relieved by voiding. Sometimes it is worse. After voiding she can have low-back pain sometimes associated with it. It is generally steady throughout the day.   She dates her symptoms back to painful retention, high volume, at age 37 when she had a left ovarian cyst removed. She was performing self catheterization I believe for 4 days after but none since.   She has postvoid dribbling but is otherwise continent. She feels like her bladder ___ but Dr. Marice Potter examined her and said she had no prolapse.   She gets up 3 times a night and denies a history of kidney stones, previous GU surgery and urinary tract infections.   She has no neurologic risk factors or symptoms. She has not had a hysterectomy. She is prone to constipation. She denies vaginal dryness and dyspareunia.   Her symptoms have not been medically treated. Her symptoms are moderate in severity and persisting.    Past Medical History Problems  1. History of  Esophageal Reflux 530.81  Surgical History Problems  1. History of  Gallbladder Surgery 2. History of  Knee Surgery  Current Meds 1. Depo-Provera 150 MG/ML Intramuscular Suspension; Therapy: (Recorded:06Nov2013) to  Allergies Medication  1. No Known Drug Allergies  Family History Problems  1. Maternal history of  Bladder Cancer V16.52 2. Maternal history of  Blood In Urine 3. Maternal history of  Brain Tumor 4. Maternal history of  Cancer skin 5. Maternal history of  Cervical Cancer 6. Family history of  Family Health Status Number Of Children 1 daughter 63. Paternal history of  Heart Disease V17.49 8. Maternal history of  Heart Disease V17.49 9. 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   Urinalysis: Negative.  Bladder Scan: Bladder scan residual was 15 mL.   Urine [Data Includes: Last 1 Day]   06Nov2013  COLOR YELLOW   APPEARANCE CLEAR   SPECIFIC GRAVITY 1.030   pH 5.5   GLUCOSE NEG mg/dL  BILIRUBIN NEG   KETONE NEG mg/dL  BLOOD SMALL   PROTEIN NEG mg/dL  UROBILINOGEN 0.2 mg/dL  NITRITE NEG   LEUKOCYTE ESTERASE NEG   SQUAMOUS EPITHELIAL/HPF FEW   WBC 0-2 WBC/hpf  RBC 0-2 RBC/hpf  BACTERIA RARE   CRYSTALS NONE SEEN   CASTS NONE SEEN    Assessment Assessed  1. Urinary Frequency 788.41 2. Abdominal Pain 789.00  Plan  Urinary Frequency (788.41)  1. PVR U/S  Done: 06Nov2013  Discussion/Summary     [name] has abdominal pain and pressure. She has frequency and nocturia. She is fearful of having bladder cancer. She may have interstitial cystitis. She has postvoid dribbling.   We had an  interesting and good conversation about her working at the [employer].   I discussed hydrodistension with [name], which also will rule out bladder cancer.  We talked about cystoscopy/hydrodistension and instillation in detail. Pros, cons, general surgical and anesthetic risks, and other options including watchful waiting were discussed. Risks were described but not limited to pain, infection, and bleeding. The risk of bladder perforation and management were discussed. The patient understands that it is primarily a  diagnostic procedure.   I am going to send a copy of my note to Dr. Nicholaus Bloom to keep her updated on her treatment course.  After a thorough review of the management options for the patient's condition the patient  elected to proceed with surgical therapy as noted above. We have discussed the potential benefits and risks of the procedure, side effects of the proposed treatment, the likelihood of the patient achieving the goals of the procedure, and any potential problems that might occur during the procedure or recuperation. Informed consent has been obtained.

## 2012-04-05 ENCOUNTER — Ambulatory Visit (HOSPITAL_BASED_OUTPATIENT_CLINIC_OR_DEPARTMENT_OTHER): Payer: BC Managed Care – PPO | Admitting: Anesthesiology

## 2012-04-05 ENCOUNTER — Ambulatory Visit (HOSPITAL_BASED_OUTPATIENT_CLINIC_OR_DEPARTMENT_OTHER)
Admission: RE | Admit: 2012-04-05 | Discharge: 2012-04-05 | Disposition: A | Discharge status: Home / Self Care | Payer: BC Managed Care – PPO | Source: Ambulatory Visit | Attending: Urology | Admitting: Urology

## 2012-04-05 ENCOUNTER — Encounter (HOSPITAL_BASED_OUTPATIENT_CLINIC_OR_DEPARTMENT_OTHER): Payer: Self-pay

## 2012-04-05 ENCOUNTER — Encounter (HOSPITAL_COMMUNITY): Admission: RE | Payer: Self-pay | Source: Ambulatory Visit

## 2012-04-05 ENCOUNTER — Encounter (HOSPITAL_BASED_OUTPATIENT_CLINIC_OR_DEPARTMENT_OTHER)
Admission: RE | Disposition: A | Discharge status: Home / Self Care | Payer: Self-pay | Source: Ambulatory Visit | Attending: Urology

## 2012-04-05 ENCOUNTER — Ambulatory Visit (HOSPITAL_COMMUNITY)
Admission: RE | Admit: 2012-04-05 | Payer: BC Managed Care – PPO | Source: Ambulatory Visit | Admitting: Obstetrics & Gynecology

## 2012-04-05 ENCOUNTER — Encounter (HOSPITAL_BASED_OUTPATIENT_CLINIC_OR_DEPARTMENT_OTHER): Payer: Self-pay | Admitting: Anesthesiology

## 2012-04-05 DIAGNOSIS — N3010 Interstitial cystitis (chronic) without hematuria: Secondary | ICD-10-CM | POA: Insufficient documentation

## 2012-04-05 DIAGNOSIS — Z79899 Other long term (current) drug therapy: Secondary | ICD-10-CM | POA: Insufficient documentation

## 2012-04-05 DIAGNOSIS — K219 Gastro-esophageal reflux disease without esophagitis: Secondary | ICD-10-CM | POA: Insufficient documentation

## 2012-04-05 HISTORY — DX: Nocturia: R35.1

## 2012-04-05 HISTORY — DX: Urgency of urination: R39.15

## 2012-04-05 HISTORY — DX: Pelvic and perineal pain: R10.2

## 2012-04-05 HISTORY — DX: Frequency of micturition: R35.0

## 2012-04-05 HISTORY — PX: CYSTO WITH HYDRODISTENSION: SHX5453

## 2012-04-05 HISTORY — DX: Personal history of other endocrine, nutritional and metabolic disease: Z86.39

## 2012-04-05 SURGERY — CYSTOSCOPY, WITH BLADDER HYDRODISTENSION
Anesthesia: General | Site: Bladder | Wound class: Clean Contaminated

## 2012-04-05 SURGERY — LIGATION, FALLOPIAN TUBE, LAPAROSCOPIC
Anesthesia: General

## 2012-04-05 MED ORDER — STERILE WATER FOR IRRIGATION IR SOLN
Status: DC | PRN
  Administered 2012-04-05: 3000 mL

## 2012-04-05 MED ORDER — PHENAZOPYRIDINE HCL 200 MG PO TABS
ORAL | Status: DC | PRN
  Administered 2012-04-05: 12:00:00 via INTRAVESICAL

## 2012-04-05 MED ORDER — PROPOFOL 10 MG/ML IV BOLUS
INTRAVENOUS | Status: DC | PRN
  Administered 2012-04-05: 200 mg via INTRAVENOUS

## 2012-04-05 MED ORDER — KETOROLAC TROMETHAMINE 30 MG/ML IJ SOLN
INTRAMUSCULAR | Status: DC | PRN
  Administered 2012-04-05: 30 mg via INTRAVENOUS

## 2012-04-05 MED ORDER — LACTATED RINGERS IV SOLN
INTRAVENOUS | Status: DC
  Administered 2012-04-05: 11:00:00 via INTRAVENOUS
  Filled 2012-04-05: qty 1000

## 2012-04-05 MED ORDER — CIPROFLOXACIN IN D5W 400 MG/200ML IV SOLN
400.0000 mg | Freq: Once | INTRAVENOUS | Status: AC
  Administered 2012-04-05: 400 mg via INTRAVENOUS
  Filled 2012-04-05: qty 200

## 2012-04-05 MED ORDER — FENTANYL CITRATE 0.05 MG/ML IJ SOLN
25.0000 ug | INTRAMUSCULAR | Status: DC | PRN
  Filled 2012-04-05: qty 1

## 2012-04-05 MED ORDER — HYDROCODONE-ACETAMINOPHEN 5-500 MG PO TABS: 1.0000 | ORAL_TABLET | Freq: Four times a day (QID) | ORAL | Status: DC | PRN

## 2012-04-05 MED ORDER — LACTATED RINGERS IV SOLN
INTRAVENOUS | Status: DC | PRN
  Administered 2012-04-05: 11:00:00 via INTRAVENOUS

## 2012-04-05 MED ORDER — LACTATED RINGERS IV SOLN
INTRAVENOUS | Status: DC
  Filled 2012-04-05: qty 1000

## 2012-04-05 MED ORDER — DEXAMETHASONE SODIUM PHOSPHATE 4 MG/ML IJ SOLN
INTRAMUSCULAR | Status: DC | PRN
  Administered 2012-04-05: 10 mg via INTRAVENOUS

## 2012-04-05 MED ORDER — FENTANYL CITRATE 0.05 MG/ML IJ SOLN
INTRAMUSCULAR | Status: DC | PRN
  Administered 2012-04-05 (×4): 50 ug via INTRAVENOUS

## 2012-04-05 MED ORDER — LIDOCAINE HCL (CARDIAC) 20 MG/ML IV SOLN
INTRAVENOUS | Status: DC | PRN
  Administered 2012-04-05: 100 mg via INTRAVENOUS

## 2012-04-05 MED ORDER — MIDAZOLAM HCL 5 MG/5ML IJ SOLN
INTRAMUSCULAR | Status: DC | PRN
  Administered 2012-04-05: 2 mg via INTRAVENOUS

## 2012-04-05 MED ORDER — CIPROFLOXACIN HCL 250 MG PO TABS: 250.0000 mg | ORAL_TABLET | Freq: Two times a day (BID) | ORAL | Status: DC

## 2012-04-05 MED ORDER — ONDANSETRON HCL 4 MG/2ML IJ SOLN
INTRAMUSCULAR | Status: DC | PRN
  Administered 2012-04-05: 4 mg via INTRAVENOUS

## 2012-04-05 SURGICAL SUPPLY — 20 items
BAG DRAIN URO-CYSTO SKYTR STRL (DRAIN) ×2 IMPLANT
CANISTER SUCT LVC 12 LTR MEDI- (MISCELLANEOUS) ×2 IMPLANT
CATH FOLEY 2WAY SLVR  5CC 18FR (CATHETERS)
CATH FOLEY 2WAY SLVR 5CC 18FR (CATHETERS) IMPLANT
CATH ROBINSON RED A/P 12FR (CATHETERS) IMPLANT
CATH ROBINSON RED A/P 14FR (CATHETERS) IMPLANT
CLOTH BEACON ORANGE TIMEOUT ST (SAFETY) ×2 IMPLANT
DRAPE CAMERA CLOSED 9X96 (DRAPES) ×2 IMPLANT
ELECT REM PT RETURN 9FT ADLT (ELECTROSURGICAL)
ELECTRODE REM PT RTRN 9FT ADLT (ELECTROSURGICAL) IMPLANT
GLOVE BIO SURGEON STRL SZ 6.5 (GLOVE) ×4 IMPLANT
GLOVE BIO SURGEON STRL SZ7.5 (GLOVE) ×2 IMPLANT
GOWN STRL REIN XL XLG (GOWN DISPOSABLE) ×2 IMPLANT
GOWN SURGICAL LARGE (GOWNS) ×2 IMPLANT
NDL SAFETY ECLIPSE 18X1.5 (NEEDLE) ×1 IMPLANT
NEEDLE HYPO 18GX1.5 SHARP (NEEDLE) ×1
PACK CYSTOSCOPY (CUSTOM PROCEDURE TRAY) ×2 IMPLANT
SUT SILK 0 TIES 10X30 (SUTURE) IMPLANT
SYR 20CC LL (SYRINGE) ×2 IMPLANT
WATER STERILE IRR 3000ML UROMA (IV SOLUTION) ×2 IMPLANT

## 2012-04-05 NOTE — Interval H&P Note (Signed)
History and Physical Interval Note:  04/05/2012 11:28 AM  [name]  has presented today for surgery, with the diagnosis of pelvic pain  The various methods of treatment have been discussed with the patient and family. After consideration of risks, benefits and other options for treatment, the patient has consented to  Procedure(s) with comments: CYSTOSCOPY/HYDRODISTENSION (N/A) - Instillation of Marcaine and Pyrdium as a surgical intervention .  The patient's history has been reviewed, patient examined, no change in status, stable for surgery.  I have reviewed the patient's chart and labs.  Questions were answered to the patient's satisfaction.     Torrence Hammack A

## 2012-04-05 NOTE — Op Note (Signed)
Preoperative diagnosis: Pelvic pain Postoperative diagnosis: Interstitial cystitis Surgery: Bladder hydrodistention plus bladder instillation therapy and cystoscopy Surgeon: Dr. Lorin Picket Eliyana Pagliaro  The patient consented the above procedure the above diagnoses. Preoperative antibiotics were given. Leg position was excellent  21 [place] scope was utilized. Bladder mucosa and trigone were normal. Is no stitch or foreign body or carcinoma. the bladder was filled to 400 mL.  After hydrodistention the bladder was emptied. I re\re cystoscoped the patient and there is a few glomerulations at 5 and 7:00. There is no bladder injury.  I emptied the bladder. A separate procedure instilled 15 cc of 0.5% Marcaine was 400 mg of peridium. She'll be treated for interstitial cystitis

## 2012-04-05 NOTE — Transfer of Care (Signed)
Immediate Anesthesia Transfer of Care Note  Patient: [name]  Procedure(s) Performed: Procedure(s) (LRB): CYSTOSCOPY/HYDRODISTENSION (N/A)  Patient Location: PACU  Anesthesia Type: General  Level of Consciousness: awake, sedated, patient cooperative and responds to stimulation  Airway & Oxygen Therapy: Patient Spontanous Breathing and Patient connected to face mask oxygen  Post-op Assessment: Report given to PACU RN, Post -op Vital signs reviewed and stable and Patient moving all extremities  Post vital signs: Reviewed and stable  Complications: No apparent anesthesia complications

## 2012-04-05 NOTE — Op Note (Signed)
Preoperative diagnosis: Pelvic pain Postoperative diagnosis: Interstitial cystitis Surgery: Cystoscopy plus hydrodistention plus bladder insulation therapy Surgeon: Dr. Lorin Picket Firmin Belisle  The patient consented the above procedure with the above diagnoses. She was given preoperative antibiotics. Leg position was good.  21 [place] scope was utilized. Bladder mucosa and trigone were normal. There is no stitch or foreign body or carcinoma. She only to be hydrodistended to 400 mL  Bladder was emptied and on reinspection she had mild glomerulations at 5 and 7:00  As a separate procedure instilled 15 cc of 0.5% Marcaine was 400 mg of peridium  The patient be treated for interstitial cystitis

## 2012-04-05 NOTE — Anesthesia Postprocedure Evaluation (Signed)
  Anesthesia Post-op Note  Patient: [name]  Procedure(s) Performed: Procedure(s) (LRB): CYSTOSCOPY/HYDRODISTENSION (N/A)  Patient Location: PACU  Anesthesia Type: General  Level of Consciousness: awake and alert   Airway and Oxygen Therapy: Patient Spontanous Breathing  Post-op Pain: mild  Post-op Assessment: Post-op Vital signs reviewed, Patient's Cardiovascular Status Stable, Respiratory Function Stable, Patent Airway and No signs of Nausea or vomiting  Last Vitals:  Filed Vitals:   04/05/12 1215  BP: 99/62  Pulse: 72  Temp:   Resp: 15    Post-op Vital Signs: stable   Complications: No apparent anesthesia complications

## 2012-04-05 NOTE — Anesthesia Procedure Notes (Signed)
Procedure Name: LMA Insertion Date/Time: 04/05/2012 11:45 AM Performed by: Jessica Priest Pre-anesthesia Checklist: Patient identified, Emergency Drugs available, Suction available and Patient being monitored Patient Re-evaluated:Patient Re-evaluated prior to inductionOxygen Delivery Method: Circle System Utilized Preoxygenation: Pre-oxygenation with 100% oxygen Intubation Type: IV induction Ventilation: Mask ventilation without difficulty LMA: LMA inserted LMA Size: 4.0 Number of attempts: 1 Airway Equipment and Method: bite block Placement Confirmation: positive ETCO2 Tube secured with: Tape Dental Injury: Teeth and Oropharynx as per pre-operative assessment

## 2012-04-05 NOTE — Anesthesia Preprocedure Evaluation (Addendum)
Anesthesia Evaluation  Patient identified by MRN, date of birth, ID band Patient awake    Reviewed: Allergy & Precautions, H&P , NPO status , Patient's Chart, lab work & pertinent test results  Airway Mallampati: II TM Distance: >3 FB Neck ROM: full    Dental no notable dental hx. (+) Teeth Intact and Dental Advisory Given   Pulmonary neg pulmonary ROS,  breath sounds clear to auscultation  Pulmonary exam normal       Cardiovascular Exercise Tolerance: Good negative cardio ROS  Rhythm:regular Rate:Normal     Neuro/Psych negative neurological ROS  negative psych ROS   GI/Hepatic negative GI ROS, Neg liver ROS, GERD-  Controlled,  Endo/Other  negative endocrine ROS  Renal/GU negative Renal ROS  negative genitourinary   Musculoskeletal   Abdominal   Peds  Hematology negative hematology ROS (+)   Anesthesia Other Findings   Reproductive/Obstetrics negative OB ROS                           Anesthesia Physical Anesthesia Plan  ASA: I  Anesthesia Plan: General   Post-op Pain Management:    Induction: Intravenous  Airway Management Planned: LMA  Additional Equipment:   Intra-op Plan:   Post-operative Plan:   Informed Consent: I have reviewed the patients History and Physical, chart, labs and discussed the procedure including the risks, benefits and alternatives for the proposed anesthesia with the patient or authorized representative who has indicated his/her understanding and acceptance.   Dental Advisory Given  Plan Discussed with: CRNA and Surgeon  Anesthesia Plan Comments:         Anesthesia Quick Evaluation

## 2012-04-06 ENCOUNTER — Encounter (HOSPITAL_BASED_OUTPATIENT_CLINIC_OR_DEPARTMENT_OTHER): Payer: Self-pay | Admitting: Urology

## 2012-04-18 ENCOUNTER — Ambulatory Visit (INDEPENDENT_AMBULATORY_CARE_PROVIDER_SITE_OTHER): Payer: BC Managed Care – PPO | Admitting: *Deleted

## 2012-04-18 DIAGNOSIS — Z3049 Encounter for surveillance of other contraceptives: Secondary | ICD-10-CM

## 2012-04-18 DIAGNOSIS — Z3042 Encounter for surveillance of injectable contraceptive: Secondary | ICD-10-CM

## 2012-04-18 MED ORDER — MEDROXYPROGESTERONE ACETATE 150 MG/ML IM SUSP
150.0000 mg | Freq: Once | INTRAMUSCULAR | Status: AC
  Administered 2012-04-18: 150 mg via INTRAMUSCULAR

## 2012-07-21 ENCOUNTER — Ambulatory Visit: Payer: BC Managed Care – PPO | Admitting: Obstetrics & Gynecology

## 2012-07-27 ENCOUNTER — Ambulatory Visit (INDEPENDENT_AMBULATORY_CARE_PROVIDER_SITE_OTHER): Payer: BC Managed Care – PPO | Admitting: Family Medicine

## 2012-07-27 ENCOUNTER — Encounter: Payer: Self-pay | Admitting: Family Medicine

## 2012-07-27 VITALS — BP 131/89 | HR 86 | Resp 16 | Ht 62.0 in | Wt 145.0 lb

## 2012-07-27 DIAGNOSIS — Z113 Encounter for screening for infections with a predominantly sexual mode of transmission: Secondary | ICD-10-CM

## 2012-07-27 DIAGNOSIS — Z124 Encounter for screening for malignant neoplasm of cervix: Secondary | ICD-10-CM

## 2012-07-27 DIAGNOSIS — Z01419 Encounter for gynecological examination (general) (routine) without abnormal findings: Secondary | ICD-10-CM

## 2012-07-27 DIAGNOSIS — Z1151 Encounter for screening for human papillomavirus (HPV): Secondary | ICD-10-CM

## 2012-07-27 DIAGNOSIS — Z3049 Encounter for surveillance of other contraceptives: Secondary | ICD-10-CM

## 2012-07-27 MED ORDER — MEDROXYPROGESTERONE ACETATE 150 MG/ML IM SUSP
150.0000 mg | Freq: Once | INTRAMUSCULAR | Status: AC
  Administered 2012-07-27: 150 mg via INTRAMUSCULAR

## 2012-07-27 NOTE — Patient Instructions (Signed)
Preventive Care for Adults, Female A healthy lifestyle and preventive care can promote health and wellness. Preventive health guidelines for women include the following key practices.  A routine yearly physical is a good way to check with your caregiver about your health and preventive screening. It is a chance to share any concerns and updates on your health, and to receive a thorough exam.  Visit your dentist for a routine exam and preventive care every 6 months. Brush your teeth twice a day and floss once a day. Good oral hygiene prevents tooth decay and gum disease.  The frequency of eye exams is based on your age, health, family medical history, use of contact lenses, and other factors. Follow your caregiver's recommendations for frequency of eye exams.  Eat a healthy diet. Foods like vegetables, fruits, whole grains, low-fat dairy products, and lean protein foods contain the nutrients you need without too many calories. Decrease your intake of foods high in solid fats, added sugars, and salt. Eat the right amount of calories for you.Get information about a proper diet from your caregiver, if necessary.  Regular physical exercise is one of the most important things you can do for your health. Most adults should get at least 150 minutes of moderate-intensity exercise (any activity that increases your heart rate and causes you to sweat) each week. In addition, most adults need muscle-strengthening exercises on 2 or more days a week.  Maintain a healthy weight. The body mass index (BMI) is a screening tool to identify possible weight problems. It provides an estimate of body fat based on height and weight. Your caregiver can help determine your BMI, and can help you achieve or maintain a healthy weight.For adults 20 years and older:  A BMI below 18.5 is considered underweight.  A BMI of 18.5 to 24.9 is normal.  A BMI of 25 to 29.9 is considered overweight.  A BMI of 30 and above is  considered obese.  Maintain normal blood lipids and cholesterol levels by exercising and minimizing your intake of saturated fat. Eat a balanced diet with plenty of fruit and vegetables. Blood tests for lipids and cholesterol should begin at age 20 and be repeated every 5 years. If your lipid or cholesterol levels are high, you are over 50, or you are at high risk for heart disease, you may need your cholesterol levels checked more frequently.Ongoing high lipid and cholesterol levels should be treated with medicines if diet and exercise are not effective.  If you smoke, find out from your caregiver how to quit. If you do not use tobacco, do not start.  If you are pregnant, do not drink alcohol. If you are breastfeeding, be very cautious about drinking alcohol. If you are not pregnant and choose to drink alcohol, do not exceed 1 drink per day. One drink is considered to be 12 ounces (355 mL) of beer, 5 ounces (148 mL) of wine, or 1.5 ounces (44 mL) of liquor.  Avoid use of street drugs. Do not share needles with anyone. Ask for help if you need support or instructions about stopping the use of drugs.  High blood pressure causes heart disease and increases the risk of stroke. Your blood pressure should be checked at least every 1 to 2 years. Ongoing high blood pressure should be treated with medicines if weight loss and exercise are not effective.  If you are 55 to 37 years old, ask your caregiver if you should take aspirin to prevent strokes.  Diabetes   screening involves taking a blood sample to check your fasting blood sugar level. This should be done once every 3 years, after age 45, if you are within normal weight and without risk factors for diabetes. Testing should be considered at a younger age or be carried out more frequently if you are overweight and have at least 1 risk factor for diabetes.  Breast cancer screening is essential preventive care for women. You should practice "breast  self-awareness." This means understanding the normal appearance and feel of your breasts and may include breast self-examination. Any changes detected, no matter how small, should be reported to a caregiver. Women in their 20s and 30s should have a clinical breast exam (CBE) by a caregiver as part of a regular health exam every 1 to 3 years. After age 40, women should have a CBE every year. Starting at age 40, women should consider having a mammography (breast X-ray test) every year. Women who have a family history of breast cancer should talk to their caregiver about genetic screening. Women at a high risk of breast cancer should talk to their caregivers about having magnetic resonance imaging (MRI) and a mammography every year.  The Pap test is a screening test for cervical cancer. A Pap test can show cell changes on the cervix that might become cervical cancer if left untreated. A Pap test is a procedure in which cells are obtained and examined from the lower end of the uterus (cervix).  Women should have a Pap test starting at age 21.  Between ages 21 and 29, Pap tests should be repeated every 2 years.  Beginning at age 30, you should have a Pap test every 3 years as long as the past 3 Pap tests have been normal.  Some women have medical problems that increase the chance of getting cervical cancer. Talk to your caregiver about these problems. It is especially important to talk to your caregiver if a new problem develops soon after your last Pap test. In these cases, your caregiver may recommend more frequent screening and Pap tests.  The above recommendations are the same for women who have or have not gotten the vaccine for human papillomavirus (HPV).  If you had a hysterectomy for a problem that was not cancer or a condition that could lead to cancer, then you no longer need Pap tests. Even if you no longer need a Pap test, a regular exam is a good idea to make sure no other problems are  starting.  If you are between ages 65 and 70, and you have had normal Pap tests going back 10 years, you no longer need Pap tests. Even if you no longer need a Pap test, a regular exam is a good idea to make sure no other problems are starting.  If you have had past treatment for cervical cancer or a condition that could lead to cancer, you need Pap tests and screening for cancer for at least 20 years after your treatment.  If Pap tests have been discontinued, risk factors (such as a new sexual partner) need to be reassessed to determine if screening should be resumed.  The HPV test is an additional test that may be used for cervical cancer screening. The HPV test looks for the virus that can cause the cell changes on the cervix. The cells collected during the Pap test can be tested for HPV. The HPV test could be used to screen women aged 30 years and older, and should   be used in women of any age who have unclear Pap test results. After the age of 30, women should have HPV testing at the same frequency as a Pap test.  Colorectal cancer can be detected and often prevented. Most routine colorectal cancer screening begins at the age of 50 and continues through age 75. However, your caregiver may recommend screening at an earlier age if you have risk factors for colon cancer. On a yearly basis, your caregiver may provide home test kits to check for hidden blood in the stool. Use of a small camera at the end of a tube, to directly examine the colon (sigmoidoscopy or colonoscopy), can detect the earliest forms of colorectal cancer. Talk to your caregiver about this at age 50, when routine screening begins. Direct examination of the colon should be repeated every 5 to 10 years through age 75, unless early forms of pre-cancerous polyps or small growths are found.  Hepatitis C blood testing is recommended for all people born from 1945 through 1965 and any individual with known risks for hepatitis C.  Practice  safe sex. Use condoms and avoid high-risk sexual practices to reduce the spread of sexually transmitted infections (STIs). STIs include gonorrhea, chlamydia, syphilis, trichomonas, herpes, HPV, and human immunodeficiency virus (HIV). Herpes, HIV, and HPV are viral illnesses that have no cure. They can result in disability, cancer, and death. Sexually active women aged 25 and younger should be checked for chlamydia. Older women with new or multiple partners should also be tested for chlamydia. Testing for other STIs is recommended if you are sexually active and at increased risk.  Osteoporosis is a disease in which the bones lose minerals and strength with aging. This can result in serious bone fractures. The risk of osteoporosis can be identified using a bone density scan. Women ages 65 and over and women at risk for fractures or osteoporosis should discuss screening with their caregivers. Ask your caregiver whether you should take a calcium supplement or vitamin D to reduce the rate of osteoporosis.  Menopause can be associated with physical symptoms and risks. Hormone replacement therapy is available to decrease symptoms and risks. You should talk to your caregiver about whether hormone replacement therapy is right for you.  Use sunscreen with sun protection factor (SPF) of 30 or more. Apply sunscreen liberally and repeatedly throughout the day. You should seek shade when your shadow is shorter than you. Protect yourself by wearing long sleeves, pants, a wide-brimmed hat, and sunglasses year round, whenever you are outdoors.  Once a month, do a whole body skin exam, using a mirror to look at the skin on your back. Notify your caregiver of new moles, moles that have irregular borders, moles that are larger than a pencil eraser, or moles that have changed in shape or color.  Stay current with required immunizations.  Influenza. You need a dose every fall (or winter). The composition of the flu vaccine  changes each year, so being vaccinated once is not enough.  Pneumococcal polysaccharide. You need 1 to 2 doses if you smoke cigarettes or if you have certain chronic medical conditions. You need 1 dose at age 65 (or older) if you have never been vaccinated.  Tetanus, diphtheria, pertussis (Tdap, Td). Get 1 dose of Tdap vaccine if you are younger than age 65, are over 65 and have contact with an infant, are a healthcare worker, are pregnant, or simply want to be protected from whooping cough. After that, you need a Td   booster dose every 10 years. Consult your caregiver if you have not had at least 3 tetanus and diphtheria-containing shots sometime in your life or have a deep or dirty wound.  HPV. You need this vaccine if you are a woman age 26 or younger. The vaccine is given in 3 doses over 6 months.  Measles, mumps, rubella (MMR). You need at least 1 dose of MMR if you were born in 1957 or later. You may also need a second dose.  Meningococcal. If you are age 19 to 21 and a first-year college student living in a residence hall, or have one of several medical conditions, you need to get vaccinated against meningococcal disease. You may also need additional booster doses.  Zoster (shingles). If you are age 60 or older, you should get this vaccine.  Varicella (chickenpox). If you have never had chickenpox or you were vaccinated but received only 1 dose, talk to your caregiver to find out if you need this vaccine.  Hepatitis A. You need this vaccine if you have a specific risk factor for hepatitis A virus infection or you simply wish to be protected from this disease. The vaccine is usually given as 2 doses, 6 to 18 months apart.  Hepatitis B. You need this vaccine if you have a specific risk factor for hepatitis B virus infection or you simply wish to be protected from this disease. The vaccine is given in 3 doses, usually over 6 months. Preventive Services / Frequency Ages 19 to 39  Blood  pressure check.** / Every 1 to 2 years.  Lipid and cholesterol check.** / Every 5 years beginning at age 20.  Clinical breast exam.** / Every 3 years for women in their 20s and 30s.  Pap test.** / Every 2 years from ages 21 through 29. Every 3 years starting at age 30 through age 65 or 70 with a history of 3 consecutive normal Pap tests.  HPV screening.** / Every 3 years from ages 30 through ages 65 to 70 with a history of 3 consecutive normal Pap tests.  Hepatitis C blood test.** / For any individual with known risks for hepatitis C.  Skin self-exam. / Monthly.  Influenza immunization.** / Every year.  Pneumococcal polysaccharide immunization.** / 1 to 2 doses if you smoke cigarettes or if you have certain chronic medical conditions.  Tetanus, diphtheria, pertussis (Tdap, Td) immunization. / A one-time dose of Tdap vaccine. After that, you need a Td booster dose every 10 years.  HPV immunization. / 3 doses over 6 months, if you are 26 and younger.  Measles, mumps, rubella (MMR) immunization. / You need at least 1 dose of MMR if you were born in 1957 or later. You may also need a second dose.  Meningococcal immunization. / 1 dose if you are age 19 to 21 and a first-year college student living in a residence hall, or have one of several medical conditions, you need to get vaccinated against meningococcal disease. You may also need additional booster doses.  Varicella immunization.** / Consult your caregiver.  Hepatitis A immunization.** / Consult your caregiver. 2 doses, 6 to 18 months apart.  Hepatitis B immunization.** / Consult your caregiver. 3 doses usually over 6 months. Ages 40 to 64  Blood pressure check.** / Every 1 to 2 years.  Lipid and cholesterol check.** / Every 5 years beginning at age 20.  Clinical breast exam.** / Every year after age 40.  Mammogram.** / Every year beginning at age 40   and continuing for as long as you are in good health. Consult with your  caregiver.  Pap test.** / Every 3 years starting at age 30 through age 65 or 70 with a history of 3 consecutive normal Pap tests.  HPV screening.** / Every 3 years from ages 30 through ages 65 to 70 with a history of 3 consecutive normal Pap tests.  Fecal occult blood test (FOBT) of stool. / Every year beginning at age 50 and continuing until age 75. You may not need to do this test if you get a colonoscopy every 10 years.  Flexible sigmoidoscopy or colonoscopy.** / Every 5 years for a flexible sigmoidoscopy or every 10 years for a colonoscopy beginning at age 50 and continuing until age 75.  Hepatitis C blood test.** / For all people born from 1945 through 1965 and any individual with known risks for hepatitis C.  Skin self-exam. / Monthly.  Influenza immunization.** / Every year.  Pneumococcal polysaccharide immunization.** / 1 to 2 doses if you smoke cigarettes or if you have certain chronic medical conditions.  Tetanus, diphtheria, pertussis (Tdap, Td) immunization.** / A one-time dose of Tdap vaccine. After that, you need a Td booster dose every 10 years.  Measles, mumps, rubella (MMR) immunization. / You need at least 1 dose of MMR if you were born in 1957 or later. You may also need a second dose.  Varicella immunization.** / Consult your caregiver.  Meningococcal immunization.** / Consult your caregiver.  Hepatitis A immunization.** / Consult your caregiver. 2 doses, 6 to 18 months apart.  Hepatitis B immunization.** / Consult your caregiver. 3 doses, usually over 6 months. Ages 65 and over  Blood pressure check.** / Every 1 to 2 years.  Lipid and cholesterol check.** / Every 5 years beginning at age 20.  Clinical breast exam.** / Every year after age 40.  Mammogram.** / Every year beginning at age 40 and continuing for as long as you are in good health. Consult with your caregiver.  Pap test.** / Every 3 years starting at age 30 through age 65 or 70 with a 3  consecutive normal Pap tests. Testing can be stopped between 65 and 70 with 3 consecutive normal Pap tests and no abnormal Pap or HPV tests in the past 10 years.  HPV screening.** / Every 3 years from ages 30 through ages 65 or 70 with a history of 3 consecutive normal Pap tests. Testing can be stopped between 65 and 70 with 3 consecutive normal Pap tests and no abnormal Pap or HPV tests in the past 10 years.  Fecal occult blood test (FOBT) of stool. / Every year beginning at age 50 and continuing until age 75. You may not need to do this test if you get a colonoscopy every 10 years.  Flexible sigmoidoscopy or colonoscopy.** / Every 5 years for a flexible sigmoidoscopy or every 10 years for a colonoscopy beginning at age 50 and continuing until age 75.  Hepatitis C blood test.** / For all people born from 1945 through 1965 and any individual with known risks for hepatitis C.  Osteoporosis screening.** / A one-time screening for women ages 65 and over and women at risk for fractures or osteoporosis.  Skin self-exam. / Monthly.  Influenza immunization.** / Every year.  Pneumococcal polysaccharide immunization.** / 1 dose at age 65 (or older) if you have never been vaccinated.  Tetanus, diphtheria, pertussis (Tdap, Td) immunization. / A one-time dose of Tdap vaccine if you are over   65 and have contact with an infant, are a healthcare worker, or simply want to be protected from whooping cough. After that, you need a Td booster dose every 10 years.  Varicella immunization.** / Consult your caregiver.  Meningococcal immunization.** / Consult your caregiver.  Hepatitis A immunization.** / Consult your caregiver. 2 doses, 6 to 18 months apart.  Hepatitis B immunization.** / Check with your caregiver. 3 doses, usually over 6 months. ** Family history and personal history of risk and conditions may change your caregiver's recommendations. Document Released: 04/07/2001 Document Revised: 05/04/2011  Document Reviewed: 07/07/2010 [employer] Patient Information 2014 [employer].  

## 2012-07-27 NOTE — Progress Notes (Signed)
  Subjective:     [name] is a 37 y.o. female and is here for a comprehensive physical exam. The patient reports no problems.  History   Social History  . Marital Status: Married    Spouse Name: N/A    Number of Children: N/A  . Years of Education: N/A   Occupational History  . Not on file.   Social History Main Topics  . Smoking status: Current Every Day Smoker -- 0.50 packs/day for 18 years    Types: Cigarettes  . Smokeless tobacco: Never Used  . Alcohol Use: 4.2 oz/week    7 Glasses of wine per week  . Drug Use: No  . Sexually Active: Yes -- Female partner(s)    Birth Control/ Protection: Injection   Other Topics Concern  . Not on file   Social History Narrative  . No narrative on file   Health Maintenance  Topic Date Due  . Tetanus/tdap  12/01/1994  . Influenza Vaccine  10/24/2012  . Pap Smear  07/07/2014    The following portions of the patient's history were reviewed and updated as appropriate: allergies, current medications, past family history, past medical history, past social history, past surgical history and problem list.  Review of Systems A comprehensive review of systems was negative.   Objective:    BP 131/89  Pulse 86  Resp 16  Ht 5\' 2"  (1.575 m)  Wt 145 lb (65.772 kg)  BMI 26.51 kg/m2  LMP 07/18/2012 General appearance: alert, cooperative and appears stated age Neck: no adenopathy, supple, symmetrical, trachea midline and thyroid not enlarged, symmetric, no tenderness/mass/nodules Lungs: clear to auscultation bilaterally Breasts: normal appearance, no masses or tenderness Heart: regular rate and rhythm, S1, S2 normal, no murmur, click, rub or gallop Abdomen: soft, non-tender; bowel sounds normal; no masses,  no organomegaly Pelvic: cervix normal in appearance, external genitalia normal, no cervical motion tenderness, uterus normal size, shape, and consistency, vagina normal without discharge and no adnexal mass, some mild tenderness on  left. Extremities: extremities normal, atraumatic, no cyanosis or edema Pulses: 2+ and symmetric Skin: Skin color, texture, turgor normal. No rashes or lesions Lymph nodes: Cervical, supraclavicular, and axillary nodes normal. Neurologic: Grossly normal    Assessment:    Healthy female exam. Depo for contraception.       Plan:  Pap and GC/Chlam today   See After Visit Summary for Counseling Recommendations

## 2012-10-10 ENCOUNTER — Ambulatory Visit: Payer: Self-pay | Admitting: Podiatry

## 2012-10-10 LAB — HCG, QUANTITATIVE, PREGNANCY: Beta Hcg, Quant.: <1 m[IU]/mL — ABNORMAL LOW

## 2012-10-19 ENCOUNTER — Ambulatory Visit (INDEPENDENT_AMBULATORY_CARE_PROVIDER_SITE_OTHER): Payer: BC Managed Care – PPO | Admitting: *Deleted

## 2012-10-19 DIAGNOSIS — Z3049 Encounter for surveillance of other contraceptives: Secondary | ICD-10-CM

## 2012-10-19 DIAGNOSIS — Z3042 Encounter for surveillance of injectable contraceptive: Secondary | ICD-10-CM

## 2012-10-19 MED ORDER — MEDROXYPROGESTERONE ACETATE 150 MG/ML IM SUSP
150.0000 mg | Freq: Once | INTRAMUSCULAR | Status: AC
  Administered 2012-10-19: 150 mg via INTRAMUSCULAR

## 2012-10-26 DIAGNOSIS — D481 Neoplasm of uncertain behavior of connective and other soft tissue: Secondary | ICD-10-CM | POA: Insufficient documentation

## 2013-01-18 ENCOUNTER — Ambulatory Visit (INDEPENDENT_AMBULATORY_CARE_PROVIDER_SITE_OTHER): Payer: BC Managed Care – PPO | Admitting: *Deleted

## 2013-01-18 DIAGNOSIS — Z3042 Encounter for surveillance of injectable contraceptive: Secondary | ICD-10-CM

## 2013-01-18 DIAGNOSIS — Z3049 Encounter for surveillance of other contraceptives: Secondary | ICD-10-CM

## 2013-01-18 MED ORDER — MEDROXYPROGESTERONE ACETATE 150 MG/ML IM SUSP
150.0000 mg | Freq: Once | INTRAMUSCULAR | Status: AC
  Administered 2013-01-18: 150 mg via INTRAMUSCULAR

## 2013-04-20 ENCOUNTER — Ambulatory Visit: Payer: BC Managed Care – PPO

## 2013-04-25 ENCOUNTER — Ambulatory Visit (INDEPENDENT_AMBULATORY_CARE_PROVIDER_SITE_OTHER): Payer: BC Managed Care – PPO | Admitting: *Deleted

## 2013-04-25 DIAGNOSIS — Z3049 Encounter for surveillance of other contraceptives: Secondary | ICD-10-CM

## 2013-04-25 DIAGNOSIS — Z3042 Encounter for surveillance of injectable contraceptive: Secondary | ICD-10-CM

## 2013-04-25 MED ORDER — MEDROXYPROGESTERONE ACETATE 150 MG/ML IM SUSP
150.0000 mg | Freq: Once | INTRAMUSCULAR | Status: AC
  Administered 2013-04-25: 150 mg via INTRAMUSCULAR

## 2013-07-27 ENCOUNTER — Ambulatory Visit (INDEPENDENT_AMBULATORY_CARE_PROVIDER_SITE_OTHER): Payer: BC Managed Care – PPO | Admitting: Obstetrics & Gynecology

## 2013-07-27 ENCOUNTER — Encounter: Payer: Self-pay | Admitting: Obstetrics & Gynecology

## 2013-07-27 VITALS — BP 113/86 | HR 87 | Ht 62.0 in | Wt 148.0 lb

## 2013-07-27 DIAGNOSIS — Z01419 Encounter for gynecological examination (general) (routine) without abnormal findings: Secondary | ICD-10-CM

## 2013-07-27 DIAGNOSIS — Z202 Contact with and (suspected) exposure to infections with a predominantly sexual mode of transmission: Secondary | ICD-10-CM

## 2013-07-27 DIAGNOSIS — Z124 Encounter for screening for malignant neoplasm of cervix: Secondary | ICD-10-CM

## 2013-07-27 DIAGNOSIS — Z0000 Encounter for general adult medical examination without abnormal findings: Secondary | ICD-10-CM

## 2013-07-27 DIAGNOSIS — Z3049 Encounter for surveillance of other contraceptives: Secondary | ICD-10-CM

## 2013-07-27 DIAGNOSIS — Z136 Encounter for screening for cardiovascular disorders: Secondary | ICD-10-CM

## 2013-07-27 DIAGNOSIS — Z1329 Encounter for screening for other suspected endocrine disorder: Secondary | ICD-10-CM

## 2013-07-27 DIAGNOSIS — Z13220 Encounter for screening for lipoid disorders: Secondary | ICD-10-CM

## 2013-07-27 DIAGNOSIS — Z1151 Encounter for screening for human papillomavirus (HPV): Secondary | ICD-10-CM

## 2013-07-27 DIAGNOSIS — Z13228 Encounter for screening for other metabolic disorders: Secondary | ICD-10-CM

## 2013-07-27 DIAGNOSIS — Z130 Encounter for screening for diseases of the blood and blood-forming organs and certain disorders involving the immune mechanism: Secondary | ICD-10-CM

## 2013-07-27 DIAGNOSIS — Z3042 Encounter for surveillance of injectable contraceptive: Secondary | ICD-10-CM

## 2013-07-27 LAB — COMPREHENSIVE METABOLIC PANEL
ALBUMIN: 4.3 g/dL (ref 3.5–5.2)
ALK PHOS: 127 U/L — AB (ref 39–117)
ALT: 21 U/L (ref 0–35)
AST: 19 U/L (ref 0–37)
BUN: 13 mg/dL (ref 6–23)
CALCIUM: 9.3 mg/dL (ref 8.4–10.5)
CHLORIDE: 107 meq/L (ref 96–112)
CO2: 23 meq/L (ref 19–32)
Creat: 0.82 mg/dL (ref 0.50–1.10)
GLUCOSE: 80 mg/dL (ref 70–99)
POTASSIUM: 4.4 meq/L (ref 3.5–5.3)
SODIUM: 140 meq/L (ref 135–145)
TOTAL PROTEIN: 7.1 g/dL (ref 6.0–8.3)
Total Bilirubin: 0.6 mg/dL (ref 0.2–1.2)

## 2013-07-27 LAB — LIPID PANEL
CHOL/HDL RATIO: 2.8 ratio
CHOLESTEROL: 141 mg/dL (ref 0–200)
HDL: 50 mg/dL (ref >39)
LDL Cholesterol: 84 mg/dL (ref 0–99)
Triglycerides: 37 mg/dL (ref <150)
VLDL: 7 mg/dL (ref 0–40)

## 2013-07-27 LAB — CBC WITH DIFFERENTIAL/PLATELET
BASOS PCT: 0 % (ref 0–1)
Basophils Absolute: 0 10*3/uL (ref 0.0–0.1)
EOS ABS: 0.2 10*3/uL (ref 0.0–0.7)
Eosinophils Relative: 2 % (ref 0–5)
HCT: 42.2 % (ref 36.0–46.0)
HEMOGLOBIN: 14.6 g/dL (ref 12.0–15.0)
LYMPHS ABS: 3.3 10*3/uL (ref 0.7–4.0)
Lymphocytes Relative: 28 % (ref 12–46)
MCH: 33 pg (ref 26.0–34.0)
MCHC: 34.6 g/dL (ref 30.0–36.0)
MCV: 95.5 fL (ref 78.0–100.0)
MONOS PCT: 5 % (ref 3–12)
Monocytes Absolute: 0.6 10*3/uL (ref 0.1–1.0)
NEUTROS ABS: 7.6 10*3/uL (ref 1.7–7.7)
NEUTROS PCT: 65 % (ref 43–77)
PLATELETS: 374 10*3/uL (ref 150–400)
RBC: 4.42 MIL/uL (ref 3.87–5.11)
RDW: 13.4 % (ref 11.5–15.5)
WBC: 11.7 10*3/uL — ABNORMAL HIGH (ref 4.0–10.5)

## 2013-07-27 MED ORDER — MEDROXYPROGESTERONE ACETATE 150 MG/ML IM SUSP
150.0000 mg | Freq: Once | INTRAMUSCULAR | Status: AC
  Administered 2013-07-27: 150 mg via INTRAMUSCULAR

## 2013-07-27 NOTE — Progress Notes (Signed)
Patient here for yearly exam.  She was bitten by a tick last week and it is still itchy and red.  This happened last Monday.

## 2013-07-27 NOTE — Progress Notes (Signed)
Subjective:    [name] is a 38 y.o. female who presents for an annual exam. She is amenorrheic with depo provera but is considering BTL in the next few years. The patient has no complaints today. The patient is sexually active. GYN screening history: last pap: was normal. The patient wears seatbelts: yes. The patient participates in regular exercise: no. Has the patient ever been transfused or tattooed?: yes. (tattoos)  The patient reports that there is not domestic violence in her life.   Menstrual History: OB History   Grav Para Term Preterm Abortions TAB SAB Ect Mult Living   2 1   1  1   1       Menarche age: 89  No LMP recorded. Patient has had an injection.    The following portions of the patient's history were reviewed and updated as appropriate: allergies, current medications, past family history, past medical history, past social history, past surgical history and problem list.  Review of Systems A comprehensive review of systems was negative. Married for 12 years. 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 +FH of breast cancer- paunt x 2   Objective:    BP 113/86  Pulse 87  Ht 5\' 2"  (1.575 m)  Wt 148 lb (67.132 kg)  BMI 27.06 kg/m2  General Appearance:    Alert, cooperative, no distress, appears stated age  Head:    Normocephalic, without obvious abnormality, atraumatic  Eyes:    PERRL, conjunctiva/corneas clear, EOM's intact, fundi    benign, both eyes  Ears:    Normal TM's and external ear canals, both ears  Nose:   Nares normal, septum midline, mucosa normal, no drainage    or sinus tenderness  Throat:   Lips, mucosa, and tongue normal; teeth and gums normal  Neck:   Supple, symmetrical, trachea midline, no adenopathy;    thyroid:  no enlargement/tenderness/nodules; no carotid   bruit or JVD  Back:     Symmetric, no curvature, ROM normal, no CVA tenderness  Lungs:     Clear to auscultation bilaterally, respirations unlabored  Chest Wall:    No tenderness  or deformity   Heart:    Regular rate and rhythm, S1 and S2 normal, no murmur, rub   or gallop  Breast Exam:    No tenderness, masses, or nipple abnormality  Abdomen:     Soft, non-tender, bowel sounds active all four quadrants,    no masses, no organomegaly  Genitalia:    Normal female without lesion, discharge or tenderness, NSSmidplane, NT, mobile, normal adnexal exam     Extremities:   Extremities normal, atraumatic, no cyanosis or edema  Pulses:   2+ and symmetric all extremities  Skin:   Skin color, texture, turgor normal, no rashes or lesions  Lymph nodes:   Cervical, supraclavicular, and axillary nodes normal  Neurologic:   CNII-XII intact, normal strength, sensation and reflexes    throughout  .    Assessment:    Healthy female exam.    Plan:     Breast self exam technique reviewed and patient encouraged to perform self-exam monthly. Thin prep Pap smear. with cotesting

## 2013-07-27 NOTE — Addendum Note (Signed)
Addended by: Erik Obey on: 07/27/2013 10:45 AM   Modules accepted: Orders

## 2013-07-28 LAB — RPR

## 2013-07-28 LAB — HEPATITIS B SURFACE ANTIGEN: HEP B S AG: NEGATIVE

## 2013-07-28 LAB — TSH: TSH: 1.498 u[IU]/mL (ref 0.350–4.500)

## 2013-07-28 LAB — HIV ANTIBODY (ROUTINE TESTING W REFLEX): HIV: NONREACTIVE

## 2013-07-28 LAB — HEPATITIS C ANTIBODY: HCV Ab: NEGATIVE

## 2013-07-31 LAB — CYTOLOGY - PAP

## 2013-10-19 ENCOUNTER — Ambulatory Visit (INDEPENDENT_AMBULATORY_CARE_PROVIDER_SITE_OTHER): Payer: BC Managed Care – PPO | Admitting: *Deleted

## 2013-10-19 DIAGNOSIS — Z3049 Encounter for surveillance of other contraceptives: Secondary | ICD-10-CM | POA: Diagnosis not present

## 2013-10-19 DIAGNOSIS — Z01812 Encounter for preprocedural laboratory examination: Secondary | ICD-10-CM

## 2013-10-19 DIAGNOSIS — N390 Urinary tract infection, site not specified: Secondary | ICD-10-CM

## 2013-10-19 LAB — POCT URINALYSIS DIPSTICK
BILIRUBIN UA: NEGATIVE
Glucose, UA: NEGATIVE
KETONES UA: NEGATIVE
Nitrite, UA: NEGATIVE
Protein, UA: NEGATIVE
Urobilinogen, UA: NEGATIVE
pH, UA: 6

## 2013-10-19 LAB — POCT URINE PREGNANCY: Preg Test, Ur: NEGATIVE

## 2013-10-19 MED ORDER — MEDROXYPROGESTERONE ACETATE 150 MG/ML IM SUSP
150.0000 mg | INTRAMUSCULAR | Status: DC
  Administered 2013-10-19: 150 mg via INTRAMUSCULAR

## 2013-10-19 MED ORDER — CIPROFLOXACIN HCL 500 MG PO TABS: 500.0000 mg | ORAL_TABLET | Freq: Two times a day (BID) | ORAL | Status: DC

## 2013-10-19 NOTE — Progress Notes (Signed)
Patient is here for Depo injection.  She had a period and has had increased breast pain and increased fatigue.  She would like a pregnancy test to be sure she is not pregnant.  She is also having increased frequency with urination and some urgency.  Pregnancy test is negative.  Urine dip shows 2+ blood and trace leukocytes.  We will call in medication for this and send urine for culture.

## 2013-10-23 LAB — CULTURE, URINE COMPREHENSIVE: Colony Count: 75000

## 2013-12-25 ENCOUNTER — Encounter: Payer: Self-pay | Admitting: Obstetrics & Gynecology

## 2014-01-16 ENCOUNTER — Ambulatory Visit (INDEPENDENT_AMBULATORY_CARE_PROVIDER_SITE_OTHER): Payer: BC Managed Care – PPO | Admitting: *Deleted

## 2014-01-16 DIAGNOSIS — Z3042 Encounter for surveillance of injectable contraceptive: Secondary | ICD-10-CM | POA: Diagnosis not present

## 2014-01-16 MED ORDER — MEDROXYPROGESTERONE ACETATE 150 MG/ML IM SUSP
150.0000 mg | Freq: Once | INTRAMUSCULAR | Status: AC
  Administered 2014-01-16: 150 mg via INTRAMUSCULAR

## 2014-01-16 NOTE — Progress Notes (Signed)
Pt here today for her Depo Provera.

## 2014-03-15 ENCOUNTER — Other Ambulatory Visit (INDEPENDENT_AMBULATORY_CARE_PROVIDER_SITE_OTHER): Payer: No Typology Code available for payment source | Admitting: *Deleted

## 2014-03-15 DIAGNOSIS — R319 Hematuria, unspecified: Secondary | ICD-10-CM

## 2014-03-15 DIAGNOSIS — N390 Urinary tract infection, site not specified: Secondary | ICD-10-CM

## 2014-03-15 DIAGNOSIS — B379 Candidiasis, unspecified: Secondary | ICD-10-CM

## 2014-03-15 LAB — POCT URINALYSIS DIPSTICK
BILIRUBIN UA: NEGATIVE
GLUCOSE UA: NEGATIVE
KETONES UA: POSITIVE
LEUKOCYTES UA: NEGATIVE
Nitrite, UA: NEGATIVE
PH UA: 6.5
Spec Grav, UA: >=1.03
Urobilinogen, UA: NEGATIVE

## 2014-03-15 MED ORDER — FLUCONAZOLE 150 MG PO TABS
150.0000 mg | ORAL_TABLET | Freq: Once | ORAL | Status: DC
Start: 2014-03-15 — End: 2014-04-06

## 2014-03-15 MED ORDER — FLUCONAZOLE 150 MG PO TABS: 150.0000 mg | ORAL_TABLET | Freq: Once | ORAL | Status: DC

## 2014-03-15 NOTE — Progress Notes (Signed)
Patient is having burning with urination and itching and white vaginal discharge that she feels like is a yeast infection.  I have called in Diflucan and sent her urine for culture as it is showing moderate blood but no leukocytes.

## 2014-03-18 LAB — CULTURE, URINE COMPREHENSIVE

## 2014-03-19 MED ORDER — CIPROFLOXACIN HCL 500 MG PO TABS: 500.0000 mg | ORAL_TABLET | Freq: Two times a day (BID) | ORAL | Status: DC

## 2014-03-19 NOTE — Addendum Note (Signed)
Addended by: Verita Schneiders A on: 03/19/2014 09:11 AM   Modules accepted: Orders

## 2014-04-06 ENCOUNTER — Encounter: Payer: Self-pay | Admitting: Obstetrics & Gynecology

## 2014-04-06 ENCOUNTER — Ambulatory Visit (INDEPENDENT_AMBULATORY_CARE_PROVIDER_SITE_OTHER): Payer: No Typology Code available for payment source | Admitting: Obstetrics & Gynecology

## 2014-04-06 VITALS — BP 125/88 | HR 117 | Ht 62.0 in | Wt 141.4 lb

## 2014-04-06 DIAGNOSIS — Z1329 Encounter for screening for other suspected endocrine disorder: Secondary | ICD-10-CM

## 2014-04-06 DIAGNOSIS — R102 Pelvic and perineal pain: Secondary | ICD-10-CM

## 2014-04-06 DIAGNOSIS — R1032 Left lower quadrant pain: Secondary | ICD-10-CM

## 2014-04-06 DIAGNOSIS — Z113 Encounter for screening for infections with a predominantly sexual mode of transmission: Secondary | ICD-10-CM

## 2014-04-06 NOTE — Progress Notes (Signed)
   Subjective:    Patient ID: [name], female    DOB: [birth year], 39 y.o.   MRN: [id number]  HPI  39 yo MW P1 here due to 11 days of heavy vaginal bleeding. She has been on depo and generally has very light bleeding on occasion.  She also complains of LLQ pain for several days.  She gives a h/o +trich and PID last year, she is suspicious of infidelity. She would like STI testing.  Review of Systems     Objective:   Physical Exam [id number] Breathing normally  Vagina- no discharge Bimanual exam reveals a probable left ovarian cyst (about 3-4 cm), tender       Assessment & Plan:  LLQ pain- probable cyst I have ordered an u/s. She plans to cancel this appt if her pain resolves by next week  I will order a TSH and cervical cultures

## 2014-04-06 NOTE — Progress Notes (Signed)
Patient is on Depo Provera.  She had 11 days worth of bleeding, some heavy days, some not.  She is also having some pain on Left side in the area of her ovary.  A little discomfort on the right, but mostly on the left.  She is also still itching and burning in her vaginal area.

## 2014-04-07 LAB — WET PREP, GENITAL
Clue Cells Wet Prep HPF POC: NONE SEEN
TRICH WET PREP: NONE SEEN
WBC WET PREP: NONE SEEN
Yeast Wet Prep HPF POC: NONE SEEN

## 2014-04-07 LAB — GC/CHLAMYDIA PROBE AMP
CT PROBE, AMP APTIMA: NEGATIVE
GC PROBE AMP APTIMA: NEGATIVE

## 2014-04-07 LAB — TSH: TSH: 1.094 u[IU]/mL (ref 0.350–4.500)

## 2014-04-11 ENCOUNTER — Ambulatory Visit (HOSPITAL_COMMUNITY): Payer: No Typology Code available for payment source

## 2014-04-11 ENCOUNTER — Ambulatory Visit (HOSPITAL_COMMUNITY)
Admission: RE | Admit: 2014-04-11 | Discharge: 2014-04-11 | Disposition: A | Discharge status: Home / Self Care | Payer: No Typology Code available for payment source | Source: Ambulatory Visit | Attending: Obstetrics & Gynecology | Admitting: Obstetrics & Gynecology

## 2014-04-11 DIAGNOSIS — R102 Pelvic and perineal pain: Secondary | ICD-10-CM | POA: Insufficient documentation

## 2014-04-12 ENCOUNTER — Telehealth: Payer: Self-pay | Admitting: *Deleted

## 2014-04-12 ENCOUNTER — Ambulatory Visit (HOSPITAL_COMMUNITY): Payer: No Typology Code available for payment source

## 2014-04-12 NOTE — Telephone Encounter (Signed)
Left message for patient all results are negative.  She will call back if she would like more details.

## 2014-04-17 ENCOUNTER — Ambulatory Visit (INDEPENDENT_AMBULATORY_CARE_PROVIDER_SITE_OTHER): Payer: No Typology Code available for payment source | Admitting: Obstetrics & Gynecology

## 2014-04-17 ENCOUNTER — Encounter: Payer: Self-pay | Admitting: Obstetrics & Gynecology

## 2014-04-17 VITALS — BP 133/96 | HR 101 | Ht 62.0 in | Wt 137.6 lb

## 2014-04-17 DIAGNOSIS — Z30011 Encounter for initial prescription of contraceptive pills: Secondary | ICD-10-CM

## 2014-04-17 DIAGNOSIS — R102 Pelvic and perineal pain: Secondary | ICD-10-CM

## 2014-04-17 MED ORDER — NORGESTREL-ETHINYL ESTRADIOL 0.3-30 MG-MCG PO TABS: 1.0000 | ORAL_TABLET | Freq: Every day | ORAL | Status: DC

## 2014-04-17 NOTE — Progress Notes (Signed)
   Subjective:    Patient ID: [name], female    DOB: 1975-06-15, 39 y.o.   MRN: [id number]  HPI  39 yo lady here for results of her w/u for DUB. She is due for her depo provera. I have discussed that I can start her on OCPs to regulate her periods. She would like this option.  Review of Systems     Objective:   Physical Exam        Assessment & Plan:  Generic lo ovral Bp check in 2 weeks

## 2014-04-17 NOTE — Progress Notes (Signed)
Discuss results of ultrasound and pain and bleeding.

## 2014-05-03 ENCOUNTER — Observation Stay: Payer: Self-pay | Admitting: Internal Medicine

## 2014-05-10 ENCOUNTER — Encounter: Payer: Self-pay | Admitting: Endocrinology

## 2014-05-10 ENCOUNTER — Ambulatory Visit (INDEPENDENT_AMBULATORY_CARE_PROVIDER_SITE_OTHER): Payer: No Typology Code available for payment source | Admitting: Endocrinology

## 2014-05-10 VITALS — BP 118/74 | HR 89 | Resp 14 | Ht 62.0 in | Wt 135.0 lb

## 2014-05-10 DIAGNOSIS — E1065 Type 1 diabetes mellitus with hyperglycemia: Secondary | ICD-10-CM | POA: Insufficient documentation

## 2014-05-10 LAB — COMPREHENSIVE METABOLIC PANEL
ALT: 30 U/L (ref 0–35)
AST: 23 U/L (ref 0–37)
Albumin: 4.1 g/dL (ref 3.5–5.2)
Alkaline Phosphatase: 142 U/L — ABNORMAL HIGH (ref 39–117)
BILIRUBIN TOTAL: 0.4 mg/dL (ref 0.2–1.2)
BUN: 10 mg/dL (ref 6–23)
CHLORIDE: 103 meq/L (ref 96–112)
CO2: 29 mEq/L (ref 19–32)
CREATININE: 0.6 mg/dL (ref 0.40–1.20)
Calcium: 9 mg/dL (ref 8.4–10.5)
GFR: 118.64 mL/min (ref >60.00)
Glucose, Bld: 218 mg/dL — ABNORMAL HIGH (ref 70–99)
Potassium: 3.9 mEq/L (ref 3.5–5.1)
SODIUM: 138 meq/L (ref 135–145)
Total Protein: 6.2 g/dL (ref 6.0–8.3)

## 2014-05-10 LAB — HM DIABETES FOOT EXAM: HM Diabetic Foot Exam: NORMAL

## 2014-05-10 LAB — POCT GLUCOSE (DEVICE FOR HOME USE)

## 2014-05-10 LAB — TSH: TSH: 0.96 u[IU]/mL (ref 0.35–4.50)

## 2014-05-10 LAB — T4, FREE: Free T4: 0.95 ng/dL (ref 0.60–1.60)

## 2014-05-10 MED ORDER — INSULIN LISPRO 100 UNIT/ML (KWIKPEN): PEN_INJECTOR | SUBCUTANEOUS | Status: AC

## 2014-05-10 NOTE — Progress Notes (Signed)
Pre visit review using our clinic review tool, if applicable. No additional management support is needed unless otherwise documented below in the visit note. 
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
## 2014-05-10 NOTE — Patient Instructions (Signed)
Check sugars 4 x daily ( before each meal and at bedtime).  Record them in a log book and bring that/meter to next appointment.   Start new insulin from tomorrow.  Take 5 units lantus tonight, and from tomorrow move the lantus to 12 units at night time.   Blood Glucose (mg/dL)  Breakfast  (Units Novolog Insulin)  Lunch  (Units Novolog Insulin)  Supper  (Units Novolog Insulin)  Nighttime (Units Novolog Insulin)   <70 Treat the low blood sugar.  Recheck blood glucose  in 15 mins. If sugar is more than 70, then take the number of units of insulin in the 70-90 row, if before a meal.   70-90   2   2   2   0  91-130 (Base Dose)  4  4  4   0   131-150  5  5  5   0   151-200  6  6  6   0   201-250  7  7  7  0   251-300  8  8  8 1    301-350  9  9  9 2    351-400  10  10  10  3    401-450  11  11  11 4    >450  12  12  12  5    Lantus insulin 12 units subcutaneous Nightly      Please come back for a follow-up appointment in 3 weeks

## 2014-05-11 ENCOUNTER — Telehealth: Payer: Self-pay | Admitting: Endocrinology

## 2014-05-11 LAB — C-PEPTIDE: C-Peptide: 0.77 ng/mL — ABNORMAL LOW (ref 0.80–3.90)

## 2014-05-11 NOTE — Telephone Encounter (Signed)
called office to run this by them due to this being a new medication and patient is uneasy about taking another dose at lunch time.  Office said to have patient call pcp Patient aware and will call PCP with next sugars and to update on condition for advise

## 2014-05-11 NOTE — Telephone Encounter (Signed)
The patient called in with a low blood sugar reading ,I stayed on the line with the patient and forwarded the call to [facility].

## 2014-05-11 NOTE — Telephone Encounter (Signed)
[facility]  Patient Name: [name]  Gender: Female  DOB: [birth year]   Age: 39 Y 5 M 10 D  Return Phone Number: [phone] (Primary), [phone] (Secondary)  Address: [address]   City/State/Zip: [place] [address]   Client [facility] [place]  Client [facility]     Contact Type Call  Call Type Triage / Clinical     Relationship To Patient Self  Appointment Disposition EMR Appointment Attempted - Not Scheduled  Info pasted into Epic Yes  Return Phone Number [phone] (Primary)  Chief Complaint Blood Sugar Low  Initial Comment Caller states her bs was 242 at 7am, ate and took new insulin, sleepy. Now bs is 85.     PreDisposition Call Doctor      Nurse Assessment  Nurse: Shawn Stall, RN, Trish Date/Time Eilene Ghazi Time): 05/11/2014 9:42:53 AM  Confirm and document reason for call. If symptomatic, describe symptoms. ---Patient is calling for self and caller states her bs was 242 at 7am, ate and took new insulin, sleepy. Now bs is 85. humalog 7 units sliding scale. next injection and bs check at lunch time. Patient cold, lightheaded and sleepy. last urinated at 6:30 this a.m. Had oatmeal this a.m. Last night took 5 units of lantis has been on lantis since hospital last Thursday and Humalong is new first dose was today. 2 weeks ago was seen and dx with diabetic. She had labs done and was uncontrolled diabetic.  Has the patient traveled out of the country within the last 30 days? ---No  Does the patient require triage? ---Yes  Related visit to physician within the last 2 weeks? ---Yes  Does the PT have any chronic conditions? (i.e. diabetes, asthma, etc.) ---Yes  List chronic conditions. ---diabetic  Did the patient indicate they were pregnant? ---No     Guidelines      Guideline Title Affirmed Question  Affirmed Notes Nurse Date/Time (Eastern Time)  Diabetes - Low Blood Sugar [1] Blood glucose < 70 mg/dl (3.9 mmol/l) or symptomatic AND [2] cause known (all triage questions negative)  Hamilton, RN, Trish 05/11/2014 9:48:54 AM   Disp. Time Eilene Ghazi Time) Disposition Final User          05/11/2014 9:59:22 AM Home Care Yes Shawn Stall, RN, Trish        Caller Understands: Yes  Disagree/Comply: Comply     Care Advice Given Per Guideline      HOME CARE: You should be able to treat this at home. REASSURANCE: * It sounds like an episode of low blood sugar (hypoglycemia) that we can treat at home. * Low blood sugar can result from taking too much diabetes medication, delayed meals, strenuous exercise, or a combination of these factors. ASYMPTOMATIC AND BLOOD GLUCOSE under 70 mg/dl (3.9 mmol/l): If the patient has no symptoms of low blood sugar and the blood glucose is under 70 mg/dl (3.9 mmol/l); the patient should RECHECK his/her blood glucose level now. LOW BLOOD SUGAR (HYPOGLYCEMIA) - DEFINITION: * Defined as a blood glucose under 70 mg/dl (3.9 mmol/l). * Symptoms of mild hypoglycemia - shakiness, weakness, not thinking clearly, headache, trembling, sweating, dizziness, palpitations, and hunger. * Symptoms of severe hypoglycemia - unable to speak, confusion, seizures, and coma. * Glucose tablets (3-4 tablets) * Table sugar or honey (3 teaspoons; 15 ml) * Pre-packaged juice box (1 box) * Juice  or soda (1/2 cup; 120 ml) * Milk (1 cup; 240 ml) LOW BLOOD SUGAR - TREATMENT - Eat some (15 gms) sugar NOW. Each of the following has the right amount of sugar: LOW BLOOD SUGAR - EXPECTED COURSE: * The symptoms should start getting better in 5 minutes. The symptoms of hypoglycemia should resolve in about 15 minutes. After the symptoms resolve, eat a small snack to prevent this from recurring. Examples include: cheese and crackers, a glass of milk, or half a sandwich. * If the symptoms of hypoglycemia are not better in 15  minutes, eat some more glucose (10 gm). DAILY RECORD: * Measure your blood glucose before breakfast and before going to bed. * Record the results and show them to your doctor at your next office visit. DAILY BLOOD GLUCOSE GOALS - You and your doctor should decide on what your blood glucose goals should be. Typical goals for most non-pregnant adults who perform daily finger-stick blood testing at home are: * Pre-prandial (before meal): 70-130 mg/dL (3.9-7.2 mmol/l) * Post-prandial (2-3 hours after a meal): Less than 180 mg/dL (10 mmol/l) CALL BACK IF: * There is no improvement within 30 minutes * Sleepiness or confusion occur * You become worse. CARE ADVICE given per Diabetes - Low Blood Sugar (Adult) guideline.   After Care Instructions Given     Call Event Type User Date / Time

## 2014-05-11 NOTE — Telephone Encounter (Signed)
Patient re took sugar and it is up to 90 she is feeling better and will call with next sugar before medication she will also notify us if she feed unusual in any way.

## 2014-05-11 NOTE — Telephone Encounter (Signed)
Spoke to patient to get some clarification. Patient stated her lowest sugar today was 77. When she talk to team health nurse she was told to drink some orange juice and recheck sugar in 66mins. Patient did as instructed. After drinking the orange juice blood sugar when up to 99. One hour later patient checked sugar again it was 213. Advised patient to take half a dose of insulin with her lunchtime meal and report sugar readings to me before 4:45pm today. Patient verbalized understanding.

## 2014-05-11 NOTE — Telephone Encounter (Signed)
Please see below- not clear to me that the patient will be calling our office or [facility] with her lunch time reading or not. Please ask her to call us - okay to take recommended half dose of humalog at mealtime for today. As I understand, her lowest sugar was in the 80s today, correct?. thanks

## 2014-05-11 NOTE — Telephone Encounter (Signed)
Patient stated that at 1:40pm BS was 189 took 3 units of insulin than at lunch which was one grilled chicken breast, green beans, okra and water. Checked BS at 1:50pm BS was 272. Please advise.

## 2014-05-11 NOTE — Telephone Encounter (Signed)
Spoke to the patient today. Advised her to take half dose Humalog for supper tonight. Decrease lantus to 10 units qhs daily. Tomorrow morning take full dose of Humalog with BF, if develop a low, then could move down to half dose of Humalog scale till Monday. She can call triage nurse if she has any further questions.  Advised her that aim is to keep sugars in the 100-<300 range for now.  Please could you call her on Monday to find out how she did over the weekend.   thanks

## 2014-05-11 NOTE — Telephone Encounter (Signed)
fyi

## 2014-05-14 NOTE — Telephone Encounter (Signed)
I think, she can continue taking 4 units of Humalog with each meal till she sees me. lantus 10 units daily.

## 2014-05-14 NOTE — Telephone Encounter (Signed)
Spoke to patient to check on her and see how she did over the weekend. Patient stated that she only took 4 units with each meal over the weekend because she was out of town watching her daughter play softball. She did not want her sugar to drop too low while on the road. Patient stated that she only took 4 units with each meal and her sugar have ranged from 147 to low 200's. Patient check sugar this a.m and it was 223, took 4 units of humalog than ate breakfast plain oatmeal and a slice of cantaloupe . One hour post breakfast patient check sugar again and reading was 347.

## 2014-05-15 NOTE — Telephone Encounter (Signed)
Patient is returning call.  °

## 2014-05-15 NOTE — Telephone Encounter (Signed)
Called patient, no answer. Left message on patient's cell phone. Also left callback number if patient has questions.

## 2014-05-15 NOTE — Telephone Encounter (Signed)
Spoke to patient to notify her of Dr. Boyd Kerbs comments. Patient verbalized understanding.

## 2014-05-16 ENCOUNTER — Telehealth: Payer: Self-pay

## 2014-05-16 NOTE — Telephone Encounter (Signed)
Noted, thanks for typing this out.

## 2014-05-16 NOTE — Telephone Encounter (Signed)
The patient called and is hoping to get advice on her blood sugar.  She stated she called the nurse on call last night due to her blood sugar dropping. She was advised not to take her insulin this am, and to call Dr.Phadke to get further advice on what to do.

## 2014-05-16 NOTE — Telephone Encounter (Signed)
PRE-MEAL  Breakfast  Lunch  Dinner  Bedtime  Overall   Glucose range:  240 2/23  210 2/22 104 2/22   Mean/median:         POST-MEAL  PC Breakfast  PC Lunch  PC Dinner   Glucose range:  243 2/23  88  Mean/median:       Patient taking 4 units with meals and held lantus last night. Patient walked an extra mile last night and was concerned about 88 sugar reading. As advised by Dr. Howell Rucks told patient to take 3 units of humalog with meals and only 2 units if exercising more. Continue same dose of lantus at bedtime. Patient verbalized understanding.

## 2014-05-16 NOTE — Telephone Encounter (Signed)
Please could you call to get more info on her sugars from yday to today and how much insulin she took and when ?  PRE-MEAL Breakfast Lunch Dinner Bedtime Overall  Glucose range:       Mean/median:        POST-MEAL PC Breakfast PC Lunch PC Dinner  Glucose range:     Mean/median:

## 2014-05-17 ENCOUNTER — Ambulatory Visit: Payer: No Typology Code available for payment source | Admitting: Endocrinology

## 2014-05-17 LAB — GLUTAMIC ACID DECARBOXYLASE AUTO ABS: Glutamic Acid Decarb Ab: 22.4 U/mL — ABNORMAL HIGH (ref <=1.0)

## 2014-05-18 LAB — ANTI-ISLET CELL ANTIBODY: PANCREATIC ISLET CELL ANTIBODY: 20 {JDF'U} — AB (ref <5)

## 2014-05-31 ENCOUNTER — Encounter: Payer: Self-pay | Admitting: Endocrinology

## 2014-05-31 ENCOUNTER — Ambulatory Visit (INDEPENDENT_AMBULATORY_CARE_PROVIDER_SITE_OTHER): Payer: No Typology Code available for payment source | Admitting: Endocrinology

## 2014-05-31 VITALS — BP 118/76 | HR 97 | Resp 14 | Ht 62.0 in | Wt 132.5 lb

## 2014-05-31 DIAGNOSIS — E1065 Type 1 diabetes mellitus with hyperglycemia: Secondary | ICD-10-CM

## 2014-05-31 LAB — POCT GLUCOSE (DEVICE FOR HOME USE)

## 2014-05-31 NOTE — Progress Notes (Signed)
REASON FOR VISIT- [name] is a 39 y.o.-year-old female,  for management of  likelyType 1 Diabetes Mellitus, uncontrolled, without complications.  HPI- Patient recalls being diagnosed with diabetes in March 2016 when she was symptomatic with unintentional 14 lbs weight loss over past few weeks with symptoms of increased thirst and urination. She was found to have glucosuria and was sent to the [facility] ER where her FS was 375. She was dehydrated and acidotic (venous PH 7.28, CO2 15). She was hospitalized for 24 hours and discharged 05/04/14 ( hospital dc summary reviewed). A1c 11.7%. Following this, she started on insulin therapy at diagnosis.   *Reports that ~2012 , routine lab testing for annual exam showed low FS in the 30s. No symptoms of reactive hypoglycemia following that in past few years.  * Now levemir will be covered per her plan  Patient is currently on basal/bolus regimen with  - Lantus 12 units qhs>>10 units  -Humalog 4+1 scale qac>>taking 3 units with each meal    her most recent  A1cs were recorded at No results found for: HGBA1C  A1c 11.7% March 2016   Patient checks her sugars 5-7  times daily with a One Touch glucometer. By meter download her sugars are-  PREMEAL Breakfast Lunch Dinner Bedtime Overall  Glucose range: 160-299 133-201 171-236 127-186   Mean/median:        POST-MEAL PC Breakfast PC Lunch PC Dinner  Glucose range:     Mean/median:       Hypoglycemia-  One recent lower reading in the 70s after walking >>since then insulin doses have been adjusted, lower readings were also during the time of changing lantus from am dosing schedule to night time dosing. Lowest sugar was n/a; she has hypoglycemia awareness in the 100s range now.    Dietary Habits- Eats three times daily. Does Carbohydrate counting. Limits Sodas/sweetened beverages. Is going to have follow up meeting with nutrition. Had been eating healthier in the past few months recently  anyways. Exercise- Walks daily since diagnosis, elliptical machine 5 min Weight- Weight has been decreasing recently.  Wt Readings from Last 3 Encounters:  05/31/14 132 lb 8 oz (60.102 kg)  05/10/14 135 lb (61.236 kg)  04/17/14 137 lb 9.6 oz (62.415 kg)    Diabetes Complications- No known complications Nephropathy-- No  CKD, last BUN/creatinine- Lab Results  Component Value Date   BUN 10 05/10/2014   CREATININE 0.60 05/10/2014   Lab Results  Component Value Date   GFR 118.64 05/10/2014      Retinopathy- Not known Last DEE was in n/a Neuropathy- no numbness and tingling in her feet. Associated history - No history of CAD or prior stroke. No Hypertension. No hypothyroidism.  her last TSH was  Lab Results  Component Value Date   TSH 0.96 05/10/2014   No hyperlipidemia. her last set of lipids were-  Lab Results  Component Value Date   CHOL 141 07/27/2013   HDL 50 07/27/2013   LDLCALC 84 07/27/2013   TRIG 37 07/27/2013   CHOLHDL 2.8 07/27/2013   I have reviewed the patient's past medical history, medications and allergies.  She is a smoker and is working on quitting.    Current Outpatient Prescriptions on File Prior to Visit  Medication Sig Dispense Refill  . acetaminophen (TYLENOL) 325 MG tablet Take 650 mg by mouth every 6 (six) hours as needed for pain.    . calcium carbonate (TUMS - DOSED IN MG ELEMENTAL CALCIUM) 500 MG chewable  tablet Chew 1 tablet by mouth daily.    Marland Kitchen ibuprofen (ADVIL,MOTRIN) 200 MG tablet Take 400 mg by mouth every 6 (six) hours as needed. For pain    . insulin lispro (HUMALOG) 100 UNIT/ML KiwkPen Inject on a sliding scale between 4-14 units with each meal. Max daily dose 45 units 15 mL 3  . vitamin C (ASCORBIC ACID) 500 MG tablet Take 500 mg by mouth daily.     No current facility-administered medications on file prior to visit.   Allergies  Allergen Reactions  . Fruit & Vegetable Daily [Nutritional Supplements] Other (See Comments)    SKIN  BOILS  CAUSED BY ORANGES      Review of Systems- [ x ]  Complains of    [  ]  denies [  ] Recent weight change [  ]  Fatigue [  ] polydipsia [  ] polyuria [  ]  nocturia [  ]  vision difficulty [  ] chest pain [  ] shortness of breath [  ] leg swelling [  ] cough [  ] nausea/vomiting [  ] diarrhea [  ] constipation [  ] abdominal pain [  ]  tingling/numbness in extremities [  ]  concern with feet ( wounds/sores)   PHYSICAL EXAM- BP 118/76 mmHg  Pulse 97  Resp 14  Ht 5\' 2"  (1.575 m)  Wt 132 lb 8 oz (60.102 kg)  BMI 24.23 kg/m2  SpO2 98% Wt Readings from Last 3 Encounters:  05/31/14 132 lb 8 oz (60.102 kg)  05/10/14 135 lb (61.236 kg)  04/17/14 137 lb 9.6 oz (62.415 kg)   Exam: deferred      ASSESSMENT/PLAN- 1.  Problem List Items Addressed This Visit      Endocrine   Type 1 diabetes mellitus with hyperglycemia - Primary    Sugars are still elevated through the day, better at night around bedtime after exercise/walk.  She has been working very hard on eating right, exercising , checking her sugars and feels overwhelmed with the new diagnosis. Reassured her and congratulated her on the efforts. She also had questions on her daughter's risk of getting Type1DM. She also has a FH of T2DM. Explained that Type 1 is usually autoimmune, Type 2 is usually combination of genetic and environ factors. Daughter and family members should be careful of their lifestyle and should be screened periodically for development of DM. Risk of T1DM could be better answered by Garment/textile technologist.  She had questions about timing of Humalog with respect to the meal, and I answered those for her.  She is also very worried about having menopause, as she had recent changes to her menses, increased heat intolerance and night time sweating. She has follow up appt with Melody Trudee Kuster later today.   Patient Instructions  Lantus 11 units at night Humalog - 4 units at BF, 4 units lunch , 3 units supper  Please  come back for a follow-up appointment in 1 month.           Relevant Medications   insulin glargine (LANTUS) 100 UNIT/ML injection       - Return to clinic in 4 weeks with meter 25 minutes spent with the patient,>50% time spent on discussion of topics mentioned above.       Dorien Mayotte PUSHKAR 06/02/2014, 9:00 AM

## 2014-05-31 NOTE — Patient Instructions (Signed)
Lantus 11 units at night Humalog - 4 units at BF, 4 units lunch , 3 units supper  Please come back for a follow-up appointment in 1 month.

## 2014-05-31 NOTE — Progress Notes (Signed)
Pre visit review using our clinic review tool, if applicable. No additional management support is needed unless otherwise documented below in the visit note. 

## 2014-06-02 NOTE — Assessment & Plan Note (Signed)
Sugars are still elevated through the day, better at night around bedtime after exercise/walk.  She has been working very hard on eating right, exercising , checking her sugars and feels overwhelmed with the new diagnosis. Reassured her and congratulated her on the efforts. She also had questions on her daughter's risk of getting Type1DM. She also has a FH of T2DM. Explained that Type 1 is usually autoimmune, Type 2 is usually combination of genetic and environ factors. Daughter and family members should be careful of their lifestyle and should be screened periodically for development of DM. Risk of T1DM could be better answered by Garment/textile technologist.  She had questions about timing of Humalog with respect to the meal, and I answered those for her.  She is also very worried about having menopause, as she had recent changes to her menses, increased heat intolerance and night time sweating. She has follow up appt with Melody Trudee Kuster later today.   Patient Instructions  Lantus 11 units at night Humalog - 4 units at BF, 4 units lunch , 3 units supper  Please come back for a follow-up appointment in 1 month.

## 2014-06-04 ENCOUNTER — Telehealth: Payer: Self-pay

## 2014-06-04 NOTE — Telephone Encounter (Signed)
-----  Message from Haydee Monica, MD sent at 06/02/2014  9:05 AM EDT ----- Regarding: note to patient Please let her know that I found the following information on a child's risk of getting DM.  This is taken from the American Diabetes Association website.  Okay to send this info in a letter. Type 1 Diabetes: Your Child's Risk  In general, if you are a man with type 1 diabetes, the odds of your child developing diabetes are 1 in 36.  If you are a woman with type 1 diabetes and your child was born before you were 25, your child's risk is 1 in 64; if your child was born after you turned 41, your child's risk is 1 in 100.  Your child's risk is doubled if you developed diabetes before age 11. If both you and your partner have type 1 diabetes, the risk is between 1 in 10 and 1 in 4.  There is an exception to these numbers. About 1 in every 7 people with type 1 diabetes has a condition called type 2 polyglandular autoimmune syndrome. In addition to having diabetes, these people also have thyroid disease and a poorly working adrenal gland. Some also have other immune system disorders. If you have this syndrome, your child's risk of getting the syndrome - including type 1 diabetes - is 1 in 2.  Researchers are learning how to predict a person's odds of getting diabetes. For example, most whites with type 1 diabetes have genes called HLA-DR3 or HLA-DR4. If you and your child are white and share these genes, your child's risk is higher. (Suspect genes in other ethnic groups are less well studied. The HLA-DR7 gene may put African Americans at risk, and the HLA-DR9 gene may put Japanese at risk.)  Other tests can also make your child's risk clearer. A special test that tells how the body responds to glucose can tell which school-aged children are most at risk.  Another more expensive test can be done for children who have siblings with type 1 diabetes. This test measures antibodies to insulin, to islet cells  in the pancreas, or to an enzyme called glutamic acid decarboxylase. High levels can indicate that a child has a higher risk of developing type 1 diabetes.  Type 2 Diabetes: Your Child's Risk  Type 2 diabetes runs in families. In part, this tendency is due to children learning bad habits - eating a poor diet, not exercising - from their parents. But there is also a genetic basis.  In general, if you have type 2 diabetes, the risk of your child getting diabetes is 1 in 7 if you were diagnosed before age 34 and 36 in 49 if you were diagnosed after age 59.  Some scientists believe that a child's risk is greater when the parent with type 2 diabetes is the mother. If both you and your partner have type 2 diabetes, your child's risk is about 1 in 2.  People with certain rare types of type 2 diabetes have different risks. If you have the rare form called maturity-onset diabetes of the young (MODY), your child has almost a 1-in-2 chance of getting it, too.  - See more at: http://www.diabetes.org/diabetes-basics/genetics-of-diabetes.html?referrer=https://www.google.com/#sthash.CfoqnUGo.dpuf

## 2014-06-04 NOTE — Telephone Encounter (Signed)
Information mailed to patient in letter form per Dr. Boyd Kerbs request.

## 2014-06-06 ENCOUNTER — Encounter: Payer: Self-pay | Admitting: Endocrinology

## 2014-06-11 ENCOUNTER — Telehealth: Payer: Self-pay

## 2014-06-11 NOTE — Telephone Encounter (Signed)
The patient called and stated her blood sugar has bottomed out the last two nights.  She is hoping to receive a call back regarding what to do tonight with her insulin.  Callback - [phone]

## 2014-06-11 NOTE — Telephone Encounter (Signed)
Called patient to find out more information about blood sugars. Patient stated that overall blood sugar are better. But on Saturday blood sugar was 160 before eating dinner patient took 3 units of insulin. 2 and a half hours later (10:30pm) patient began to feel dizzy, light headed and sick on her stomach. Patient checked blood sugar which was 68. On Sunday patient checked sugar before dinner blood sugar was 188 took insulin as ordered. About an hour after dinner patient felt "weird" again. Check sugar at that time and it was 88. Patient does not like how she feels when sugars drop below 90. Please advise.

## 2014-06-12 NOTE — Telephone Encounter (Signed)
Spoke to patient to notify her of Dr. Boyd Kerbs comments Patient verbalized understanding. Patient stated that she has not changed her eating habits or but she is more active in the evenings everyday than daytime hours. No lows to report today. Morning blood sugar 204 and lunch time 94 today.

## 2014-06-12 NOTE — Telephone Encounter (Signed)
Called patient to notify her of Dr. Boyd Kerbs comments. No answer, left voicemail on cell phone requesting a callback and change in supper time insulin dose.

## 2014-06-12 NOTE — Telephone Encounter (Signed)
Patient made aware of Dr. Boyd Kerbs comments.

## 2014-06-12 NOTE — Telephone Encounter (Signed)
Continue current insulin for now.  If she starts noticing more lows, then please ask her to give me her detailed sugar log for further adjustments.

## 2014-06-12 NOTE — Telephone Encounter (Signed)
Looks like she is getting too much meal time insulin at Supper either because she is limiting her portion sizes at that time or doing excess activity.  Decrease meal time Humalog to 2 units at supper.   Any lows in the morning or rest of the day? If not, then okay to continue insulin dosing at other times.

## 2014-06-24 NOTE — Discharge Summary (Signed)
PATIENT NAME:  [name], [name] MR#:  [id number] DATE OF BIRTH:  Jun 28, 1975  DATE OF ADMISSION:  05/03/2014 DATE OF DISCHARGE:  05/04/2014  DISCHARGE DIAGNOSES: 1. Uncontrolled diabetes.  2. New onset diabetes.  3. Recent urinary tract infection and itching in vaginal area.   MEDICATIONS ON DISCHARGE: 1. Vitamin C 500 mg oral tablet once a day.  2. Advil 200 mg oral tablet 2 tablets every 6 hours as needed for pain.  3. Lantus 15 units subcutaneous once a day in the morning.   DIET ON DISCHARGE:   Carbohydrate-controlled diet.  Consistency: Regular.   ACTIVITY: As tolerated.   TIMEFRAME TO FOLLOW:   Within in 1-2 weeks with primary care physician and Dr. Gabriel Carina.   HISTORY OF PRESENT ILLNESS: A 39 year old female since January had been issue with bladder infection,  was dehydrated and given antibiotics. She was having some yeast infection and bleeding from vagina from past two months.   She had a 14 pound weight loss in a few weeks and she was feeling very thirsty. She got some rash and contact dermatitis also from wearing pads.  Gynecologist switched to birth control pills, but that was making her anxious and she stopped taking that. There was some glucose present in her urine and so she was told that she was diabetic, sent over to primary care physician and from there she was referred to ER as her blood sugar level was 375, CO2 level was 15 and venous pH was 7.28 on admission.   HOSPITAL COURSE AND STAY:   1. She was admitted with uncontrolled diabetes and dehydration and had some mild acidosis on presentation, given insulin and Lantus and started on insulin sliding scale coverage, given IV fluid also to hydrate her and with a day she had significant response.  Her blood sugar was reasonably under control with Lantus 15 units and so we discharged her on Lantus to be taken at home.  2. Hypokalemia, we replaced potassium in IV fluid and orally.  3. Vagina. Itching.  She was on fluconazole  local cream as outpatient and advised to continue with primary care physician.   IMPORTANT LABORATORY RESULTS IN THE HOSPITAL:   1. Glucose level 375, BUN 9, creatinine 0.82, sodium 136, potassium 3.4, chloride 105, and CO2 of 15 on admission.  2. Hemoglobin A1c 11.7, pH 7.28 and pCO2 of 25 on admission.  3. Urinalysis showed 17 WBC and leukocyte esterase on admission.  4. WBC count 8.2, hemoglobin 11.5, and platelet count 182,000.  5. On further follow-up glucose level was 201, Creatinine 0.55, sodium 139, potassium 3.6, chloride 116, and CO2 of 14.   TOTAL TIME SPENT ON THIS DISCHARGE:  FORTY MINUTES.    ____________________________ Ceasar Lund Anselm Jungling, MD vgv:tr D: 05/08/2014 11:08:19 ET T: 05/08/2014 13:45:57 ET JOB#: [id number]  cc: Ceasar Lund. Anselm Jungling, MD, <Dictator> A. Lavone Orn, MD [name], MD  Vaughan Basta MD ELECTRONICALLY SIGNED 05/15/2014 12:15

## 2014-06-24 NOTE — H&P (Signed)
PATIENT NAME:  [name], [name] MR#:  [id number] DATE OF BIRTH:  09/29/1975  DATE OF ADMISSION:  05/03/2014  PRIMARY CARE PHYSICIAN:  Dr. Rance Muir office, saw Dr. Sanda Klein today.    CHIEF COMPLAINT: Diabetes.   HISTORY OF PRESENT ILLNESS:  A 39 year old female who since January has been having some issues, had a bladder infection, was dehydrated, and was given antibiotics, then got a yeast infection. She has been bleeding vaginally for the past 2 months, she had a 14 pound weight loss in a few weeks. She has been very thirsty. She got a rash, contact dermatitis, from wearing pads. The gynecologist switched her birth control pills, it was too much hormone which made her very anxious, so she stopped taking that. She switched gynecologists, went to [facility], [facility], did a pregnancy test which was negative, but her urine glucose was elevated, was told that she was a diabetic, was sent over to her primary care physician who referred her into the ER for treatment. In the ER she was found to have a sugar of 375, anion gap was 16, CO2 was 15, venous pH 7.28. Hospitalist services were contacted for further evaluation.   PAST MEDICAL HISTORY:  Just diagnosed with diabetes.   PAST SURGICAL HISTORY: Cholecystectomy, knee surgery, bladder surgery, ovarian cyst surgery.   ALLERGIES: No known drug allergies.   MEDICATIONS: That she is taking on a daily basis none, none.   SOCIAL HISTORY: Smokes half a pack per day. Does drink a glass of wine per day. No alcohol. Works as a Designer, industrial/product.   FAMILY HISTORY:  Father with diabetes, CVA, and heart disease. Mother with ovarian cancer, brain cancer, and bladder cancer.   REVIEW OF SYSTEMS:   CONSTITUTIONAL: Positive for chills. Positive for sweating at night. Positive for 14 pound weight loss in 3 weeks. Positive for fatigue.  EYES: Her vision has gotten worse lately, follows with the eye doctor as outpatient.  EARS, NOSE, MOUTH, AND THROAT: No hearing  loss. No sore throat. No difficulty swallowing.  CARDIOVASCULAR: No chest pain. No palpitations.  RESPIRATORY: No shortness of breath. Positive for cough. No sputum. No hemoptysis.  GASTROINTESTINAL: No nausea. No vomiting. No abdominal pain. No diarrhea. Does see some blood in the bowel movements and dark stools every once in a while.  GENITOURINARY: Positive for vaginal bleeding, some burning on urination.  INTEGUMENT: Has a contact dermatitis from wearing pads.  PSYCHIATRIC: Positive for anxiety with the recent birth control pills.  ENDOCRINE: No thyroid problems.  HEMATOLOGIC AND LYMPHATIC: No anemia.   PHYSICAL EXAMINATION:  VITAL SIGNS: Temperature 98.6, pulse 85, respirations 20, blood pressure 115/87, pulse oximetry 100% on room air.  GENERAL: No respiratory distress.  EYES: Conjunctivae and lids normal. Pupils equal, round, and reactive to light. Extraocular muscles intact. No nystagmus.  EARS, NOSE, MOUTH, AND THROAT: Tympanic membranes, no erythema. Nasal mucosa, no erythema. Throat, no erythema, no exudate seen. Lips and gums, no lesions.  NECK: No JVD. No bruits. No lymphadenopathy. No thyromegaly. No thyroid nodules palpated.  RESPIRATORY:  Lungs clear to auscultation. No use of accessory muscles to breathe. No rhonchi, rales, or wheeze heard.  CARDIOVASCULAR: S1, S2 normal. No gallops, rubs, or murmurs heard. Carotid upstroke 2 + bilaterally. No bruits. Dorsalis pedis pulses 2 + bilaterally. No edema in lower extremity.  ABDOMEN: Soft, nontender. No organomegaly/splenomegaly. Normoactive bowel sounds. No masses felt.  LYMPHATIC: No lymph nodes in the neck.  MUSCULOSKELETAL: No clubbing, edema, cyanosis.  SKIN:  What I saw, no rash or ulcers seen.  PSYCHIATRIC: The patient is oriented to person, place, and time.  NEUROLOGIC: Cranial nerves II through XII grossly intact. Deep tendon reflexes 2 + bilateral lower extremities.  PSYCHIATRIC: The patient oriented to person, place, and  time.   LABORATORY AND RADIOLOGICAL DATA: Urinalysis greater than 500 mg/dL of glucose, 2 + ketones, 1 + blood, trace leukocyte esterase. Venous pH 7.28. Glucose 375, BUN 9, creatinine 0.82, sodium 136, potassium 3.4, chloride 105, CO2 of 15, calcium 9.4. White blood cell count 10.5, H and H 15.3 and 45.1, platelet count of 228,000.   ASSESSMENT AND PLAN:  1.  Uncontrolled diabetes with dehydration and acidosis on laboratory data. This is a new diagnosis of diabetes. Currently the patient is not in diabetic ketoacidosis. Diabetic teaching will be done by the nursing staff.  Diet discussed at length by me, exercise, and weight being stable in good range would be a good idea. I will give insulin 10 units IV stat, put on Apidra sliding scale, and start Lantus in the evening. I will give vigorous IV fluid hydration. The patient is receiving 2 liters in the ER, I will give another liter wide open, and then maintenance IV fluids.  2.  Hypokalemia. I will replace potassium in the IV fluids.  3.  Tobacco abuse. Smoking cessation counseling done 3 minutes by me. Refused nicotine patch. 4.  Vaginal bleeding. Follow up as outpatient.    TIME SPENT ON PATIENT CARE: 50 minutes. The patient will be admitted as an observation, likely the patient can go home tomorrow.    ____________________________ Tana Conch. Leslye Peer, MD rjw:bu D: 05/03/2014 17:06:39 ET T: 05/03/2014 17:16:16 ET JOB#: [id number]  cc: Tana Conch. Leslye Peer, MD, <Dictator> Enid Derry, MD Marisue Brooklyn MD ELECTRONICALLY SIGNED 05/11/2014 12:44

## 2014-06-26 ENCOUNTER — Encounter: Payer: Self-pay | Admitting: Endocrinology

## 2014-06-28 ENCOUNTER — Encounter: Payer: Self-pay | Admitting: Endocrinology

## 2014-06-28 ENCOUNTER — Telehealth: Payer: Self-pay

## 2014-06-28 ENCOUNTER — Other Ambulatory Visit: Payer: Self-pay | Admitting: Endocrinology

## 2014-06-28 DIAGNOSIS — E1065 Type 1 diabetes mellitus with hyperglycemia: Secondary | ICD-10-CM

## 2014-06-28 MED ORDER — PEN NEEDLES 31G X 6 MM MISC: Status: DC

## 2014-06-28 MED ORDER — PEN NEEDLES 31G X 6 MM MISC: Status: AC

## 2014-06-28 NOTE — Telephone Encounter (Signed)
Rx sent to pharmacy as requested.

## 2014-06-28 NOTE — Telephone Encounter (Signed)
The pt is hoping to get a refill of her insulin needles  Pharmacy - [facility] in [place]  Pt callback - [phone]

## 2014-07-05 ENCOUNTER — Ambulatory Visit: Payer: No Typology Code available for payment source | Admitting: Endocrinology

## 2014-07-11 ENCOUNTER — Ambulatory Visit (INDEPENDENT_AMBULATORY_CARE_PROVIDER_SITE_OTHER): Payer: No Typology Code available for payment source | Admitting: Endocrinology

## 2014-07-11 ENCOUNTER — Encounter: Payer: Self-pay | Admitting: Endocrinology

## 2014-07-11 ENCOUNTER — Other Ambulatory Visit: Payer: Self-pay

## 2014-07-11 VITALS — BP 116/74 | HR 74 | Resp 12 | Ht 62.0 in | Wt 126.8 lb

## 2014-07-11 DIAGNOSIS — E1065 Type 1 diabetes mellitus with hyperglycemia: Secondary | ICD-10-CM

## 2014-07-11 MED ORDER — GLUCOSE BLOOD VI STRP: ORAL_STRIP | Status: AC

## 2014-07-11 MED ORDER — INJECTION DEVICE FOR INSULIN DEVI: Status: DC

## 2014-07-11 MED ORDER — ONETOUCH DELICA LANCETS 33G MISC: Status: AC

## 2014-07-11 NOTE — Progress Notes (Signed)
REASON FOR VISIT- [name] is a 39 y.o.-year-old female,  for management of  likelyType 1 Diabetes Mellitus, uncontrolled, without complications. Last visit April 2016  HPI- Patient recalls being diagnosed with diabetes in March 2016 when she was symptomatic with unintentional 14 lbs weight loss over past few weeks with symptoms of increased thirst and urination. She was found to have glucosuria and was sent to the [facility] ER where her FS was 375. She was dehydrated and acidotic (venous PH 7.28, CO2 15). She was hospitalized for 24 hours and discharged 05/04/14 ( hospital dc summary reviewed). A1c 11.7%. Following this, she started on insulin therapy at diagnosis.   *Reports that ~2012 , routine lab testing for annual exam showed low FS in the 30s. No symptoms of reactive hypoglycemia following that in past few years.  * Now levemir will be covered per her plan  Patient is currently on basal/bolus regimen with  - Lantus 12 units qhs>>11 units  -Humalog 4+1 scale qac>>taking 3 units with each meal>>4/4/2 qac    her most recent  A1cs were recorded at No results found for: HGBA1C  A1c 11.7% March 2016   Patient checks her sugars 3-4  times daily with a One Touch glucometer. By meter download her sugars are-  PREMEAL Breakfast Lunch Dinner Bedtime Overall  Glucose range: 166-216 83-165 119-215    Mean/median:        POST-MEAL PC Breakfast PC Lunch PC Dinner  Glucose range:     Mean/median:       Hypoglycemia-  No recent lows, feels symptomatic when her sugars are around 80-90  Lowest sugar was n/a; she has hypoglycemia awareness in the 100s range now.    Dietary Habits- Eats three times daily. Does Carbohydrate counting. Limits Sodas/sweetened beverages. Is going to have follow up meeting with nutrition. Had been eating healthier in the past few months recently anyways. Exercise- Walks daily since diagnosis, elliptical machine 5 min Weight- Weight has been decreasing recently.   Wt Readings from Last 3 Encounters:  07/11/14 126 lb 12.8 oz (57.516 kg)  05/31/14 132 lb 8 oz (60.102 kg)  05/10/14 135 lb (61.236 kg)    Diabetes Complications- No known complications Nephropathy-- No  CKD, last BUN/creatinine- Lab Results  Component Value Date   BUN 10 05/10/2014   CREATININE 0.60 05/10/2014   Lab Results  Component Value Date   GFR 118.64 05/10/2014      Retinopathy- Not known Last DEE was in n/a Neuropathy- no numbness and tingling in her feet. Associated history - No history of CAD or prior stroke. No Hypertension. No hypothyroidism.  her last TSH was  Lab Results  Component Value Date   TSH 0.96 05/10/2014   No hyperlipidemia. her last set of lipids were-  Lab Results  Component Value Date   CHOL 141 07/27/2013   HDL 50 07/27/2013   LDLCALC 84 07/27/2013   TRIG 37 07/27/2013   CHOLHDL 2.8 07/27/2013   I have reviewed the patient's past medical history, medications and allergies.  She is a smoker and is working on quitting.    Current Outpatient Prescriptions on File Prior to Visit  Medication Sig Dispense Refill  . acetaminophen (TYLENOL) 325 MG tablet Take 650 mg by mouth every 6 (six) hours as needed for pain.    . calcium carbonate (TUMS - DOSED IN MG ELEMENTAL CALCIUM) 500 MG chewable tablet Chew 1 tablet by mouth daily.    Marland Kitchen ibuprofen (ADVIL,MOTRIN) 200 MG tablet Take  400 mg by mouth every 6 (six) hours as needed. For pain    . insulin glargine (LANTUS) 100 UNIT/ML injection Inject 10 Units into the skin at bedtime.    . insulin lispro (HUMALOG) 100 UNIT/ML KiwkPen Inject on a sliding scale between 4-14 units with each meal. Max daily dose 45 units 15 mL 3  . Insulin Pen Needle (PEN NEEDLES) 31G X 6 MM MISC Use as directed with insulin pen four times daily 120 each 3  . vitamin C (ASCORBIC ACID) 500 MG tablet Take 500 mg by mouth daily.     No current facility-administered medications on file prior to visit.   Allergies  Allergen  Reactions  . Fruit & Vegetable Daily [Nutritional Supplements] Other (See Comments)    SKIN BOILS  CAUSED BY ORANGES      Review of Systems- [ x ]  Complains of    [  ]  denies [ x ] Recent weight change [ x ]  Fatigue [  ] polydipsia [  ] polyuria [  ]  nocturia [  ]  vision difficulty [  ] chest pain [  ] shortness of breath [  ] leg swelling [  ] cough [  ] nausea/vomiting [  ] diarrhea [  ] constipation [  ] abdominal pain [  ]  tingling/numbness in extremities [  ]  concern with feet ( wounds/sores)   PHYSICAL EXAM- BP 116/74 mmHg  Pulse 74  Resp 12  Ht 5\' 2"  (1.575 m)  Wt 126 lb 12.8 oz (57.516 kg)  BMI 23.19 kg/m2 Wt Readings from Last 3 Encounters:  07/11/14 126 lb 12.8 oz (57.516 kg)  05/31/14 132 lb 8 oz (60.102 kg)  05/10/14 135 lb (61.236 kg)   Exam: deferred      ASSESSMENT/PLAN- 1.  Problem List Items Addressed This Visit      Endocrine   Type 1 diabetes mellitus with hyperglycemia - Primary    Sugars are still elevated through the day, better at mid day.  She has been working very hard on eating right, exercising , checking her sugars and feels overwhelmed with the new diagnosis. Reassured her and congratulated her on the efforts. Change to meal time scale for Humalog with Luxura pen, as she is very insulin sensitive  Patient Instructions   Insulin changes as follows: Humalog luxura pen   Blood Glucose (mg/dL)  Breakfast  (Units Humalog Insulin)  Lunch  (Units Humalog Insulin)  Supper  (Units Humalog Insulin)  Nighttime (Units Humalog Insulin)   <70 Treat the low blood sugar.  Recheck blood glucose  in 15 mins. If sugar is more than 70, then take the number of units of insulin in the 70-90 row, if before a meal.   70-90   2   2   1   0  91-130 (Base Dose)  4  4  2   0   131-150  4.5  4.5  2.5  0   151-200  5  5  3   0   201-250  5.5  5.5  3.5 0   251-300  6  6  4 1    301-350  6.5  6.5  4.5 2   351-400  7  7   5  3    401-450  7.5  7.5  5.5 4   >450  8  8  6  5    Lantus insulin 11 units subcutaneous Nightly     Please come back  for a follow-up appointment in 3 weeks                 - Return to clinic in 3 weeks with meter. Fasting labs at next visit.         Sahara Fujimoto PUSHKAR 07/11/2014, 2:34 PM

## 2014-07-11 NOTE — Assessment & Plan Note (Signed)
Sugars are still elevated through the day, better at mid day.  She has been working very hard on eating right, exercising , checking her sugars and feels overwhelmed with the new diagnosis. Reassured her and congratulated her on the efforts. Change to meal time scale for Humalog with Luxura pen, as she is very insulin sensitive  Patient Instructions   Insulin changes as follows: Humalog luxura pen   Blood Glucose (mg/dL)  Breakfast  (Units Humalog Insulin)  Lunch  (Units Humalog Insulin)  Supper  (Units Humalog Insulin)  Nighttime (Units Humalog Insulin)   <70 Treat the low blood sugar.  Recheck blood glucose  in 15 mins. If sugar is more than 70, then take the number of units of insulin in the 70-90 row, if before a meal.   70-90   2   2   1   0  91-130 (Base Dose)  4  4  2   0   131-150  4.5  4.5  2.5  0   151-200  5  5  3   0   201-250  5.5  5.5  3.5 0   251-300  6  6  4 1    301-350  6.5  6.5  4.5 2   351-400  7  7  5  3    401-450  7.5  7.5  5.5 4   >450  8  8  6  5    Lantus insulin 11 units subcutaneous Nightly     Please come back for a follow-up appointment in 3 weeks

## 2014-07-11 NOTE — Patient Instructions (Addendum)
Insulin changes as follows: Humalog luxura pen   Blood Glucose (mg/dL)  Breakfast  (Units Humalog Insulin)  Lunch  (Units Humalog Insulin)  Supper  (Units Humalog Insulin)  Nighttime (Units Humalog Insulin)   <70 Treat the low blood sugar.  Recheck blood glucose  in 15 mins. If sugar is more than 70, then take the number of units of insulin in the 70-90 row, if before a meal.   70-90   2   2   1   0  91-130 (Base Dose)  4  4  2   0   131-150  4.5  4.5  2.5  0   151-200  5  5  3   0   201-250  5.5  5.5  3.5 0   251-300  6  6  4 1    301-350  6.5  6.5  4.5 2   351-400  7  7  5  3    401-450  7.5  7.5  5.5 4   >450  8  8  6  5    Lantus insulin 11 units subcutaneous Nightly     Please come back for a follow-up appointment in 3 weeks

## 2014-07-11 NOTE — Progress Notes (Signed)
Pre visit review using our clinic review tool, if applicable. No additional management support is needed unless otherwise documented below in the visit note. 

## 2014-07-12 ENCOUNTER — Other Ambulatory Visit: Payer: Self-pay | Admitting: Endocrinology

## 2014-07-12 ENCOUNTER — Encounter: Payer: Self-pay | Admitting: Endocrinology

## 2014-07-16 ENCOUNTER — Ambulatory Visit (INDEPENDENT_AMBULATORY_CARE_PROVIDER_SITE_OTHER): Payer: No Typology Code available for payment source

## 2014-07-16 ENCOUNTER — Encounter: Payer: Self-pay | Admitting: Podiatry

## 2014-07-16 ENCOUNTER — Ambulatory Visit (INDEPENDENT_AMBULATORY_CARE_PROVIDER_SITE_OTHER): Payer: No Typology Code available for payment source | Admitting: Podiatry

## 2014-07-16 VITALS — Ht 62.0 in | Wt 125.0 lb

## 2014-07-16 DIAGNOSIS — L600 Ingrowing nail: Secondary | ICD-10-CM | POA: Diagnosis not present

## 2014-07-16 DIAGNOSIS — E119 Type 2 diabetes mellitus without complications: Secondary | ICD-10-CM

## 2014-07-16 DIAGNOSIS — M79671 Pain in right foot: Secondary | ICD-10-CM | POA: Diagnosis not present

## 2014-07-16 NOTE — Progress Notes (Signed)
   Subjective:    Patient ID: [name], female    DOB: 12/21/1975, 39 y.o.   MRN: [id number] Ingrown nails 1st toe , pt has had ingrown nails since March . She normally cuts them out her self but now that she is type I diabetic she will be coming to a doctor for her foot care. Her last A1c was 11.9 in March 2016, she returns back in June for another A1c.  HPI    Review of Systems  Constitutional: Positive for fatigue and unexpected weight change.       Night sweats   Cardiovascular:       Calf pain when walking   Gastrointestinal:       Reflux   Endocrine:       Diabetic   Skin: Positive for rash.       Change in hair or nails Skin cancer   Neurological: Positive for light-headedness.  All other systems reviewed and are negative.      Objective:   Physical Exam : I have reviewed her past medical history medications allergy surgery social history and review of systems. Pulses are strongly palpable bilateral. Neurologic sensorium is intact per Semmes-Weinstein monofilament. Deep tendon reflexes are intact bilateral and muscle strength +5 over 5 dorsiflexion plantar flexors and inverters everters all just musculature is intact. Orthopedic evaluation of the [facility] all joints distal to the ankle for range of motion without crepitation. Cutaneous evaluation of the [place] supple well-hydrated cutis with exception of psoriasis to the plantar aspect of the left foot. She also has a sharp incurvated nail margin along the tibial and fibula border of the hallux left. This is tender on palpation but lacks edema and erythema.        Assessment & Plan:   assessment: insulin-dependent diabetes mellitus with an A1c  11.9 and daily blood sugars greater than 220. An ingrown nail hallux left.  Plan: we discussed etiology pathology conservative versus surgical therapies. At this point I recommended that we not perform this elective procedure until she has her diabetes under better control I  suggested that she at least have her A1c and an 8.0 or her daily levels well below 180. I will follow-up with her in the near future.

## 2014-08-02 ENCOUNTER — Encounter: Payer: Self-pay | Admitting: Endocrinology

## 2014-08-02 ENCOUNTER — Ambulatory Visit (INDEPENDENT_AMBULATORY_CARE_PROVIDER_SITE_OTHER): Payer: No Typology Code available for payment source | Admitting: Endocrinology

## 2014-08-02 VITALS — BP 102/58 | HR 73 | Resp 12 | Ht 62.0 in | Wt 125.2 lb

## 2014-08-02 DIAGNOSIS — E1065 Type 1 diabetes mellitus with hyperglycemia: Secondary | ICD-10-CM | POA: Diagnosis not present

## 2014-08-02 LAB — MICROALBUMIN / CREATININE URINE RATIO
Creatinine,U: 81.8 mg/dL
MICROALB/CREAT RATIO: 0.9 mg/g (ref 0.0–30.0)
Microalb, Ur: <0.7 mg/dL (ref 0.0–1.9)

## 2014-08-02 LAB — COMPREHENSIVE METABOLIC PANEL
ALT: 72 U/L — ABNORMAL HIGH (ref 0–35)
AST: 39 U/L — ABNORMAL HIGH (ref 0–37)
Albumin: 4.3 g/dL (ref 3.5–5.2)
Alkaline Phosphatase: 129 U/L — ABNORMAL HIGH (ref 39–117)
BILIRUBIN TOTAL: 0.5 mg/dL (ref 0.2–1.2)
BUN: 16 mg/dL (ref 6–23)
CHLORIDE: 104 meq/L (ref 96–112)
CO2: 28 mEq/L (ref 19–32)
CREATININE: 0.68 mg/dL (ref 0.40–1.20)
Calcium: 9.4 mg/dL (ref 8.4–10.5)
GFR: 102.56 mL/min (ref >60.00)
Glucose, Bld: 130 mg/dL — ABNORMAL HIGH (ref 70–99)
Potassium: 4.1 mEq/L (ref 3.5–5.1)
Sodium: 138 mEq/L (ref 135–145)
TOTAL PROTEIN: 6.7 g/dL (ref 6.0–8.3)

## 2014-08-02 LAB — LIPID PANEL
CHOLESTEROL: 143 mg/dL (ref 0–200)
HDL: 49.6 mg/dL (ref >39.00)
LDL CALC: 82 mg/dL (ref 0–99)
NONHDL: 93.4
Total CHOL/HDL Ratio: 3
Triglycerides: 56 mg/dL (ref 0.0–149.0)
VLDL: 11.2 mg/dL (ref 0.0–40.0)

## 2014-08-02 LAB — HEMOGLOBIN A1C: Hgb A1c MFr Bld: 7.6 % — ABNORMAL HIGH (ref 4.6–6.5)

## 2014-08-02 NOTE — Progress Notes (Signed)
Patient ID: [name], female   DOB: [birth year], 39 y.o.   MRN: [id number]   REASON FOR VISIT- [name] is a 39 y.o.-year-old female,  for management of  likelyType 1 Diabetes Mellitus, uncontrolled, without complications. Last visit May 2016  HPI- Patient recalls being diagnosed with diabetes in March 2016 when she was symptomatic with unintentional 14 lbs weight loss over past few weeks with symptoms of increased thirst and urination. She was found to have glucosuria and was sent to the [facility] ER where her FS was 375. She was dehydrated and acidotic (venous PH 7.28, CO2 15). She was hospitalized for 24 hours and discharged 05/04/14 ( hospital dc summary reviewed). A1c 11.7%. Following this, she started on insulin therapy at diagnosis.   *Reports that ~2012 , routine lab testing for annual exam showed low FS in the 30s. No symptoms of reactive hypoglycemia following that in past few years.    Patient is currently on basal/bolus regimen with  - Lantus 11 units qhs  -Humalog 4+1 scale qac>>taking 3 units with each meal>>4/4/2 qac * didn't start the luxura pen as yet, as wants to use out her existing Humalog pens    her most recent  A1cs were recorded at  Lab Results  Component Value Date   HGBA1C 7.6* 08/02/2014    A1c 11.7% March 2016   Patient checks her sugars 3-4  times daily with a One Touch glucometer. By meter download her sugars are-  PREMEAL Breakfast Lunch Dinner Bedtime Overall  Glucose range: 151-245 139-196 114-215 131-222   Mean/median:        POST-MEAL PC Breakfast PC Lunch PC Dinner  Glucose range:     Mean/median:      *last week has been having higher readings, ?thinks it is hormone related  Hypoglycemia-  One recent lows of 65, feels symptomatic when her sugars are around 80-90  Lowest sugar was 65; she has hypoglycemia awareness in the 100s range now.    Dietary Habits- Eats three times daily. Does Carbohydrate counting. Limits Sodas/sweetened  beverages. Is going to have follow up meeting with nutrition. Had been eating healthier in the past few months recently anyways. Exercise- Walks daily since diagnosis, elliptical machine 5 min>>increased exercise since last time Weight- Weight recently stable  Wt Readings from Last 3 Encounters:  08/02/14 125 lb 4 oz (56.813 kg)  07/16/14 125 lb (56.7 kg)  07/11/14 126 lb 12.8 oz (57.516 kg)    Diabetes Complications- No known complications Nephropathy-- No  CKD, last BUN/creatinine- Lab Results  Component Value Date   BUN 16 08/02/2014   CREATININE 0.68 08/02/2014   Lab Results  Component Value Date   GFR 102.56 08/02/2014      Retinopathy- Not known Last DEE was in n/a Neuropathy- no numbness and tingling in her feet. Associated history - No history of CAD or prior stroke. No Hypertension. No hypothyroidism.  her last TSH was  Lab Results  Component Value Date   TSH 0.96 05/10/2014   No hyperlipidemia. her last set of lipids were-  Lab Results  Component Value Date   CHOL 143 08/02/2014   HDL 49.60 08/02/2014   LDLCALC 82 08/02/2014   TRIG 56.0 08/02/2014   CHOLHDL 3 08/02/2014   I have reviewed the patient's past medical history, medications and allergies.  She is a smoker and is working on quitting.    Current Outpatient Prescriptions on File Prior to Visit  Medication Sig Dispense Refill  .  acetaminophen (TYLENOL) 325 MG tablet Take 650 mg by mouth every 6 (six) hours as needed for pain.    . calcium carbonate (TUMS - DOSED IN MG ELEMENTAL CALCIUM) 500 MG chewable tablet Chew 1 tablet by mouth daily.    Marland Kitchen glucose blood (ONE TOUCH ULTRA TEST) test strip Use for sugar checks 4 times daily 120 each 12  . ibuprofen (ADVIL,MOTRIN) 200 MG tablet Take 400 mg by mouth every 6 (six) hours as needed. For pain    . injection device for insulin (HUMAPEN LUXURA HD) DEVI Use between 4-14 units with each meal based on sliding scale. TDD 45 units 2 each 1  . insulin glargine  (LANTUS) 100 UNIT/ML injection Inject 11 Units into the skin at bedtime.     . insulin lispro (HUMALOG) 100 UNIT/ML KiwkPen Inject on a sliding scale between 4-14 units with each meal. Max daily dose 45 units 15 mL 3  . Insulin Pen Needle (PEN NEEDLES) 31G X 6 MM MISC Use as directed with insulin pen four times daily 120 each 3  . ONETOUCH DELICA LANCETS 38T MISC Use for sugar checks 4  Times daily 120 each 11  . vitamin C (ASCORBIC ACID) 500 MG tablet Take 500 mg by mouth daily.     No current facility-administered medications on file prior to visit.   Allergies  Allergen Reactions  . Fruit & Vegetable Daily [Nutritional Supplements] Other (See Comments)    SKIN BOILS  CAUSED BY ORANGES      Review of Systems- [ x ]  Complains of    [  ]  denies [ x ] Recent weight change [ x ]  Fatigue [  ] polydipsia [  ] polyuria [  ]  nocturia [  ]  vision difficulty [  ] chest pain [  ] shortness of breath [  ] leg swelling [  ] cough [  ] nausea/vomiting [  ] diarrhea [  ] constipation [  ] abdominal pain [  ]  tingling/numbness in extremities [  ]  concern with feet ( wounds/sores)   PHYSICAL EXAM- BP 102/58 mmHg  Pulse 73  Resp 12  Ht 5\' 2"  (1.575 m)  Wt 125 lb 4 oz (56.813 kg)  BMI 22.90 kg/m2  SpO2 99% Wt Readings from Last 3 Encounters:  08/02/14 125 lb 4 oz (56.813 kg)  07/16/14 125 lb (56.7 kg)  07/11/14 126 lb 12.8 oz (57.516 kg)   Exam: deferred      ASSESSMENT/PLAN- 1.  Problem List Items Addressed This Visit      Endocrine   Type 1 diabetes mellitus with hyperglycemia - Primary    Sugars are still elevated through the day, with lesser hypoglycemia.  She has been working very hard on eating right, exercising , checking her sugars and feels overwhelmed with the new diagnosis. Reassured her and congratulated her on the efforts. Change to meal time scale for Humalog with Luxura pen, as she is very insulin sensitive- asked her to make this change - so that her meal times  numbers could come down.  Says that current insulin regimen is expensive. Written down alternative names for her and she will find out whether they are better covered. Given sample savings cards as well.   Patient Instructions  Check sugars 4 x daily ( before each meal and at bedtime).  Record them in a log book and bring that/meter to next appointment.  Change lantus to 12 units daily. Start  luxura pen. Fasting labs today. Make eye doctor appt.   Please come back for a follow-up appointment in 6 weeks               Relevant Orders   Comprehensive metabolic panel (Completed)   Hemoglobin A1c (Completed)   Microalbumin / creatinine urine ratio   Lipid panel (Completed)       - Return to clinic in 6 weeks with meter.  Explained that [place] and she has elected to follow up with my colleagues at [facility] location.         Meaghen Vecchiarelli PUSHKAR 08/07/2014, 8:58 AM

## 2014-08-02 NOTE — Patient Instructions (Signed)
Check sugars 4 x daily ( before each meal and at bedtime).  Record them in a log book and bring that/meter to next appointment.  Change lantus to 12 units daily. Start luxura pen. Fasting labs today. Make eye doctor appt.   Please come back for a follow-up appointment in 6 weeks

## 2014-08-02 NOTE — Progress Notes (Signed)
Pre visit review using our clinic review tool, if applicable. No additional management support is needed unless otherwise documented below in the visit note. 

## 2014-08-03 ENCOUNTER — Ambulatory Visit: Payer: Self-pay | Admitting: Obstetrics and Gynecology

## 2014-08-07 NOTE — Assessment & Plan Note (Signed)
Sugars are still elevated through the day, with lesser hypoglycemia.  She has been working very hard on eating right, exercising , checking her sugars and feels overwhelmed with the new diagnosis. Reassured her and congratulated her on the efforts. Change to meal time scale for Humalog with Luxura pen, as she is very insulin sensitive- asked her to make this change - so that her meal times numbers could come down.  Says that current insulin regimen is expensive. Written down alternative names for her and she will find out whether they are better covered. Given sample savings cards as well.   Patient Instructions  Check sugars 4 x daily ( before each meal and at bedtime).  Record them in a log book and bring that/meter to next appointment.  Change lantus to 12 units daily. Start luxura pen. Fasting labs today. Make eye doctor appt.   Please come back for a follow-up appointment in 6 weeks

## 2014-08-21 ENCOUNTER — Ambulatory Visit: Payer: Self-pay | Admitting: Obstetrics and Gynecology

## 2014-08-22 ENCOUNTER — Encounter: Payer: Self-pay | Admitting: *Deleted

## 2014-08-28 ENCOUNTER — Encounter: Payer: Self-pay | Admitting: Endocrinology

## 2014-08-28 ENCOUNTER — Other Ambulatory Visit: Payer: Self-pay | Admitting: Endocrinology

## 2014-08-28 MED ORDER — INSULIN GLARGINE 100 UNIT/ML ~~LOC~~ SOLN: 11.0000 [IU] | Freq: Every day | SUBCUTANEOUS | Status: DC

## 2014-08-29 ENCOUNTER — Ambulatory Visit: Payer: Self-pay | Admitting: Obstetrics and Gynecology

## 2014-08-29 ENCOUNTER — Telehealth: Payer: Self-pay | Admitting: *Deleted

## 2014-08-29 MED ORDER — INSULIN DETEMIR 100 UNIT/ML FLEXPEN: PEN_INJECTOR | SUBCUTANEOUS | Status: DC

## 2014-08-29 NOTE — Telephone Encounter (Signed)
Fax from [facility], denied [employer], commercial. "Per [employer]'s Step-therapy Policy, a step through Levemir pens is required for coverage of Lantus pens." Ok to send in new Rx for Levemir pens to replace Lantus?

## 2014-08-29 NOTE — Telephone Encounter (Signed)
Fax from pharmacy, needing PA for Lantus Solostar. Completed online, pending response.

## 2014-08-29 NOTE — Telephone Encounter (Signed)
Yes, that would be fine - same dose.

## 2014-08-29 NOTE — Telephone Encounter (Signed)
Rx sent to pharmacy by escript. Pt notified by mychart of change.

## 2014-08-29 NOTE — Addendum Note (Signed)
Addended by: Wynonia Lawman E on: 08/29/2014 02:20 PM   Modules accepted: Orders, Medications

## 2014-09-06 ENCOUNTER — Ambulatory Visit (INDEPENDENT_AMBULATORY_CARE_PROVIDER_SITE_OTHER): Payer: No Typology Code available for payment source | Admitting: Obstetrics and Gynecology

## 2014-09-06 ENCOUNTER — Encounter: Payer: Self-pay | Admitting: Obstetrics and Gynecology

## 2014-09-06 VITALS — BP 115/70 | HR 74 | Ht 62.0 in | Wt 121.1 lb

## 2014-09-06 DIAGNOSIS — Z30018 Encounter for initial prescription of other contraceptives: Secondary | ICD-10-CM | POA: Diagnosis not present

## 2014-09-06 DIAGNOSIS — Z975 Presence of (intrauterine) contraceptive device: Secondary | ICD-10-CM

## 2014-09-06 LAB — POCT URINE PREGNANCY: Preg Test, Ur: NEGATIVE

## 2014-09-06 MED ORDER — LEVONORGESTREL 20 MCG/24HR IU IUD: 1.0000 | INTRAUTERINE_SYSTEM | Freq: Once | INTRAUTERINE | Status: AC

## 2014-09-06 NOTE — Progress Notes (Signed)
Patient ID: [name], female   DOB: [birth year], 39 y.o.   MRN: [id number]  Here for Mirena insertion: [name] is a 39 y.o. year old G1P0011 Caucasian female who presents for placement of a Mirena IUD.  Patient's last menstrual period was 08/27/2014. BP 115/70 mmHg  Pulse 74  Ht 5\' 2"  (1.575 m)  Wt 121 lb 1.6 oz (54.931 kg)  BMI 22.14 kg/m2  LMP 08/27/2014 Last sexual intercourse was < 3 weeks ago, and pregnancy test today was negative  The risks and benefits of the method and placement have been thouroughly reviewed with the patient and all questions were answered.  Specifically the patient is aware of failure rate of 02/998, expulsion of the IUD and of possible perforation.  The patient is aware of irregular bleeding due to the method and understands the incidence of irregular bleeding diminishes with time.  Signed copy of informed consent in chart.   Time out was performed.  A graves speculum was placed in the vagina.  The cervix was visualized, prepped using Betadine, and grasped with a single tooth tenaculum. The uterus was found to be neutral and it sounded to 8 cm.  Mirena IUD placed per manufacturer's recommendations.   The strings were trimmed to 3 cm.    The patient was given post procedure instructions, including signs and symptoms of infection and to check for the strings after each menses or each month, and refraining from intercourse or anything in the vagina for 3 days.  She was given a Mirena care card with date Mirena placed, and date Mirena to be removed.    Cochise Dinneen Valene Bors, CNM

## 2014-09-18 ENCOUNTER — Ambulatory Visit (INDEPENDENT_AMBULATORY_CARE_PROVIDER_SITE_OTHER): Payer: No Typology Code available for payment source | Admitting: Internal Medicine

## 2014-09-18 ENCOUNTER — Encounter: Payer: Self-pay | Admitting: Internal Medicine

## 2014-09-18 VITALS — BP 110/70 | HR 81 | Temp 98.6°F | Resp 16 | Ht 62.0 in | Wt 122.0 lb

## 2014-09-18 DIAGNOSIS — E1065 Type 1 diabetes mellitus with hyperglycemia: Secondary | ICD-10-CM | POA: Diagnosis not present

## 2014-09-18 MED ORDER — GLUCAGON (RDNA) 1 MG IJ KIT: 1.0000 mg | PACK | Freq: Once | INTRAMUSCULAR | Status: AC | PRN

## 2014-09-18 NOTE — Patient Instructions (Addendum)
Blood glucose Breakfast Lunch Dinner  <70: treat blood sugar     70-90 2 2 2   91-130 4 4 4   131-150 5 5 4.5  151-200 5.5 5.5 5  201-250 6 6 5.5  251-300 7 7 6   >300 7.5 7.5 7   Please change the insulin doses as above.  Continue Levemir 12 units at bedtime.  Please return in 1.5 months with your sugar log.   Basic Rules for Patients with Type I Diabetes Mellitus  1. The American Diabetes Association (ADA) recommended targets: - fasting sugar <130 - after meal sugar <180 - HbA1C <7%  2. Engage in ?150 min moderate exercise per week  3. Make sure you have ?8h of sleep every night as this helps both blood sugars and your weight.  4. Always keep a sugar log (not only record in your meter) and bring it to all appointments with [place].  5. "15-15 rule" for hypoglycemia: if sugars are low, take 15 g of carbs** ("fast sugar" - e.g. 4 glucose tablets, 4 oz orange juice), wait 15 min, then check sugars again. If still <80, repeat. Continue  until your sugars >80, then eat a normal meal.   6. Teach family members and coworkers to inject glucagon. Have a glucagon set at home and one at work. They should call 911 after using the set.  7. Check sugar before driving. If <100, correct, and only start driving if sugars rise ?100. Check sugar every hour when on a long drive.  8. Check sugar before exercising. If <100, correct, and only start exercising if sugars rise ?100. Check sugar every hour when on a long exercise routine and 1h after you finished exercising.   If >250, check urine for ketones. If you have moderate-large ketones in urine, do not start exercise. Hydrate yourself with clear liquids and correct the high sugar. Recheck sugars and ketones before attempting to exercise.  Be aware that you might need less insulin when exercising.  *intense, short, exercise bursts can increase your sugars, but  *less intense, longer (>1h), exercise routines can decrease your sugars.   9. Make sure  you have a MedAlert bracelet or pendant mentioning "Type I Diabetes Mellitus". If you have a prior episode of severe hypoglycemia or hypoglycemia unawareness, it should also mention this.  10. Please do not walk barefoot. Inspect your feet for sores/cuts and let us know if you have them.  11. Please call [facility] with any questions and concerns [phone]).   **E.g. of "fast carbs": ? first choice (15 g):  1 tube glucose gel, GlucoPouch 15, 2 oz glucose liquid ? second choice (15-16 g):  3 or 4 glucose tablets (best taken  with water), 15 Dextrose Bits chewable ? third choice (15-20 g):   cup fruit juice,  cup regular soda, 1 cup skim milk,  1 cup sports drink ? fourth choice (15-20 g):  1 small tube [place] gel (not frosting), 2 tbsp raisins, 1 tbsp table sugar,  candy, jelly beans, gum drops - check package for carb amount   (adapted from: Lenice Pressman. "Insulin therapy and hypoglycemia" Endocrinol Metab Clin N Am 2012, 41: 57-87)
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
## 2014-10-04 ENCOUNTER — Other Ambulatory Visit: Payer: Self-pay | Admitting: Endocrinology

## 2014-10-05 ENCOUNTER — Telehealth: Payer: Self-pay | Admitting: Endocrinology

## 2014-10-05 NOTE — Telephone Encounter (Signed)
[name], can you please call pt to see how she is doing. Are sugars better?

## 2014-10-05 NOTE — Telephone Encounter (Signed)
[facility] note dated 10/04/14 at 10:15 PM Caller states that her blood sugar is 479 wants to know if should take fast acting insulin. Is type 1 diabetic.  Caller states that her blood sugar is 479 wants to know if should take fast acting insulin is type 1 diabetic. Caller states that she has had no cheating on her diet and has been having high blood sugars all week and has had no fevers. No cough, no runny nose, and has been having the blood sugar of 327 this amand hs been before her lunch was 204 and then before dinner was 383 and has late dinner this PM and has taken 5 u of humalog this AM and has 4.5 u of humalog at lunch and has had humalog 4.5 u at dinner and has levemir 12 u before bed nightly  Call placed to the caller to notify her of the need to increase the levemire to 15 u and to take extra water and to recheck her blood sugar in the MA and to call the office to reort her AM blood sugar results verbalized understanding  Vertell Novak 10/04/14 @ 10:31 PM spoke with on call--received a return call from the on call MD and informed her of the patient status and the blood sugar results and she directed to have her to take extra water tonight and to increase the levemir to 15 u tonight and to check her blood sugar in the AM and call the office to let them know how she is doing

## 2014-10-12 ENCOUNTER — Other Ambulatory Visit: Payer: Self-pay | Admitting: Endocrinology

## 2014-10-22 ENCOUNTER — Telehealth: Payer: Self-pay | Admitting: Obstetrics and Gynecology

## 2014-10-22 NOTE — Telephone Encounter (Signed)
PT WANTS A NEW REFERRAL TO THE ENDOCRINOLOGIST (ELIZABETH HARRIS)   AT [place]!!!

## 2014-10-22 NOTE — Telephone Encounter (Signed)
Would you please refer pt to [facility] # is [phone]- diabetic [name]

## 2014-10-23 ENCOUNTER — Other Ambulatory Visit: Payer: Self-pay | Admitting: Obstetrics and Gynecology

## 2014-10-31 ENCOUNTER — Telehealth: Payer: Self-pay

## 2014-10-31 DIAGNOSIS — E1065 Type 1 diabetes mellitus with hyperglycemia: Secondary | ICD-10-CM

## 2014-10-31 NOTE — Telephone Encounter (Signed)
That's fine; new referral entered

## 2014-10-31 NOTE — Telephone Encounter (Signed)
She has been seeing Dr. Boyd Kerbs replacement, Dr. Cruzita Lederer, for her DM, but she is not happy with her. She would like to be referred to Dr. [name] at [facility] End[facility] clinic.

## 2014-10-31 NOTE — Telephone Encounter (Signed)
Routing to Dr. Lada 

## 2014-10-31 NOTE — Telephone Encounter (Signed)
Called pt and lvm to return call.  

## 2014-10-31 NOTE — Assessment & Plan Note (Signed)
Refer to endo, see phone note

## 2014-11-08 ENCOUNTER — Ambulatory Visit: Payer: No Typology Code available for payment source | Admitting: Internal Medicine

## 2014-11-12 ENCOUNTER — Ambulatory Visit: Payer: No Typology Code available for payment source | Admitting: Internal Medicine

## 2014-11-13 ENCOUNTER — Encounter: Payer: Self-pay | Admitting: Family Medicine

## 2014-11-13 ENCOUNTER — Ambulatory Visit (INDEPENDENT_AMBULATORY_CARE_PROVIDER_SITE_OTHER): Payer: No Typology Code available for payment source | Admitting: Family Medicine

## 2014-11-13 VITALS — BP 113/78 | HR 87 | Temp 99.4°F | Wt 117.0 lb

## 2014-11-13 DIAGNOSIS — G8911 Acute pain due to trauma: Secondary | ICD-10-CM

## 2014-11-13 DIAGNOSIS — Z720 Tobacco use: Secondary | ICD-10-CM | POA: Insufficient documentation

## 2014-11-13 DIAGNOSIS — J0100 Acute maxillary sinusitis, unspecified: Secondary | ICD-10-CM | POA: Insufficient documentation

## 2014-11-13 DIAGNOSIS — M25512 Pain in left shoulder: Secondary | ICD-10-CM

## 2014-11-13 DIAGNOSIS — E1065 Type 1 diabetes mellitus with hyperglycemia: Secondary | ICD-10-CM | POA: Diagnosis not present

## 2014-11-13 MED ORDER — AMOXICILLIN 875 MG PO TABS: 875.0000 mg | ORAL_TABLET | Freq: Two times a day (BID) | ORAL | Status: DC

## 2014-11-13 NOTE — Assessment & Plan Note (Addendum)
Patient to see endocrinologist soon and will get A1C through that office; encouragement given to keep up the good work; patient will schedule an eye exam; foot care regularly (I thought she had mild athlete's foot, but podiatrist and derm apparently think it's psoriasis); check FSBS 4x a day as directed by endo

## 2014-11-13 NOTE — Patient Instructions (Addendum)
Do start the antibiotic Please do eat yogurt daily or take a probiotic daily for the next month or two We want to replace the healthy germs in the gut If you notice foul, watery diarrhea in the next two months, schedule an appointment RIGHT AWAY Try vitamin C (orange juice if not diabetic or vitamin C tablets) and drink green tea to help your immune system during your illness Get plenty of rest and hydration Try turmeric as a natural anti-inflammatory (for pain and arthritis). It comes in capsules where you buy aspirin and fish oil, but also as a spice where you buy pepper and garlic powder. Do try to stop smoking  Smoking Cessation, Tips for Success If you are ready to quit smoking, congratulations! You have chosen to help yourself be healthier. Cigarettes bring nicotine, tar, carbon monoxide, and other irritants into your body. Your lungs, heart, and blood vessels will be able to work better without these poisons. There are many different ways to quit smoking. Nicotine gum, nicotine patches, a nicotine inhaler, or nicotine nasal spray can help with physical craving. Hypnosis, support groups, and medicines help break the habit of smoking. WHAT THINGS CAN I DO TO MAKE QUITTING EASIER?  Here are some tips to help you quit for good:  Pick a date when you will quit smoking completely. Tell all of your friends and family about your plan to quit on that date.  Do not try to slowly cut down on the number of cigarettes you are smoking. Pick a quit date and quit smoking completely starting on that day.  Throw away all cigarettes.   Clean and remove all ashtrays from your home, work, and car.  On a card, write down your reasons for quitting. Carry the card with you and read it when you get the urge to smoke.  Cleanse your body of nicotine. Drink enough water and fluids to keep your urine clear or pale yellow. Do this after quitting to flush the nicotine from your body.  Learn to predict your moods.  Do not let a bad situation be your excuse to have a cigarette. Some situations in your life might tempt you into wanting a cigarette.  Never have "just one" cigarette. It leads to wanting another and another. Remind yourself of your decision to quit.  Change habits associated with smoking. If you smoked while driving or when feeling stressed, try other activities to replace smoking. Stand up when drinking your coffee. Brush your teeth after eating. Sit in a different chair when you read the paper. Avoid alcohol while trying to quit, and try to drink fewer caffeinated beverages. Alcohol and caffeine may urge you to smoke.  Avoid foods and drinks that can trigger a desire to smoke, such as sugary or spicy foods and alcohol.  Ask people who smoke not to smoke around you.  Have something planned to do right after eating or having a cup of coffee. For example, plan to take a walk or exercise.  Try a relaxation exercise to calm you down and decrease your stress. Remember, you may be tense and nervous for the first 2 weeks after you quit, but this will pass.  Find new activities to keep your hands busy. Play with a pen, coin, or rubber band. Doodle or draw things on paper.  Brush your teeth right after eating. This will help cut down on the craving for the taste of tobacco after meals. You can also try mouthwash.   Use oral substitutes  in place of cigarettes. Try using lemon drops, carrots, cinnamon sticks, or chewing gum. Keep them handy so they are available when you have the urge to smoke.  When you have the urge to smoke, try deep breathing.  Designate your home as a nonsmoking area.  If you are a heavy smoker, ask your health care provider about a prescription for nicotine chewing gum. It can ease your withdrawal from nicotine.  Reward yourself. Set aside the cigarette money you save and buy yourself something nice.  Look for support from others. Join a support group or smoking cessation  program. Ask someone at home or at work to help you with your plan to quit smoking.  Always ask yourself, "Do I need this cigarette or is this just a reflex?" Tell yourself, "Today, I choose not to smoke," or "I do not want to smoke." You are reminding yourself of your decision to quit.  Do not replace cigarette smoking with electronic cigarettes (commonly called e-cigarettes). The safety of e-cigarettes is unknown, and some may contain harmful chemicals.  If you relapse, do not give up! Plan ahead and think about what you will do the next time you get the urge to smoke. HOW WILL I FEEL WHEN I QUIT SMOKING? You may have symptoms of withdrawal because your body is used to nicotine (the addictive substance in cigarettes). You may crave cigarettes, be irritable, feel very hungry, cough often, get headaches, or have difficulty concentrating. The withdrawal symptoms are only temporary. They are strongest when you first quit but will go away within 10-14 days. When withdrawal symptoms occur, stay in control. Think about your reasons for quitting. Remind yourself that these are signs that your body is healing and getting used to being without cigarettes. Remember that withdrawal symptoms are easier to treat than the major diseases that smoking can cause.  Even after the withdrawal is over, expect periodic urges to smoke. However, these cravings are generally short lived and will go away whether you smoke or not. Do not smoke! WHAT RESOURCES ARE AVAILABLE TO HELP ME QUIT SMOKING? Your health care provider can direct you to community resources or hospitals for support, which may include:  Group support.  Education.  Hypnosis.  Therapy. Document Released: 11/08/2003 Document Revised: 06/26/2013 Document Reviewed: 07/28/2012 [facility] Patient Information 2015 [place], [place]. This information is not intended to replace advice given to you by your health care provider. Make sure you discuss any questions you  have with your health care provider. Sinusitis Sinusitis is redness, soreness, and inflammation of the paranasal sinuses. Paranasal sinuses are air pockets within the bones of your face (beneath the eyes, the middle of the forehead, or above the eyes). In healthy paranasal sinuses, mucus is able to drain out, and air is able to circulate through them by way of your nose. However, when your paranasal sinuses are inflamed, mucus and air can become trapped. This can allow bacteria and other germs to grow and cause infection. Sinusitis can develop quickly and last only a short time (acute) or continue over a long period (chronic). Sinusitis that lasts for more than 12 weeks is considered chronic.  CAUSES  Causes of sinusitis include:  Allergies.  Structural abnormalities, such as displacement of the cartilage that separates your nostrils (deviated septum), which can decrease the air flow through your nose and sinuses and affect sinus drainage.  Functional abnormalities, such as when the small hairs (cilia) that line your sinuses and help remove mucus do not work  properly or are not present. SIGNS AND SYMPTOMS  Symptoms of acute and chronic sinusitis are the same. The primary symptoms are pain and pressure around the affected sinuses. Other symptoms include:  Upper toothache.  Earache.  Headache.  Bad breath.  Decreased sense of smell and taste.  A cough, which worsens when you are lying flat.  Fatigue.  Fever.  Thick drainage from your nose, which often is green and may contain pus (purulent).  Swelling and warmth over the affected sinuses. DIAGNOSIS  Your health care provider will perform a physical exam. During the exam, your health care provider may:  Look in your nose for signs of abnormal growths in your nostrils (nasal polyps).  Tap over the affected sinus to check for signs of infection.  View the inside of your sinuses (endoscopy) using an imaging device that has a light  attached (endoscope). If your health care provider suspects that you have chronic sinusitis, one or more of the following tests may be recommended:  Allergy tests.  Nasal culture. A sample of mucus is taken from your nose, sent to a lab, and screened for bacteria.  Nasal cytology. A sample of mucus is taken from your nose and examined by your health care provider to determine if your sinusitis is related to an allergy. TREATMENT  Most cases of acute sinusitis are related to a viral infection and will resolve on their own within 10 days. Sometimes medicines are prescribed to help relieve symptoms (pain medicine, decongestants, nasal steroid sprays, or saline sprays).  However, for sinusitis related to a bacterial infection, your health care provider will prescribe antibiotic medicines. These are medicines that will help kill the bacteria causing the infection.  Rarely, sinusitis is caused by a fungal infection. In theses cases, your health care provider will prescribe antifungal medicine. For some cases of chronic sinusitis, surgery is needed. Generally, these are cases in which sinusitis recurs more than 3 times per year, despite other treatments. HOME CARE INSTRUCTIONS   Drink plenty of water. Water helps thin the mucus so your sinuses can drain more easily.  Use a humidifier.  Inhale steam 3 to 4 times a day (for example, sit in the bathroom with the shower running).  Apply a warm, moist washcloth to your face 3 to 4 times a day, or as directed by your health care provider.  Use saline nasal sprays to help moisten and clean your sinuses.  Take medicines only as directed by your health care provider.  If you were prescribed either an antibiotic or antifungal medicine, finish it all even if you start to feel better. SEEK IMMEDIATE MEDICAL CARE IF:  You have increasing pain or severe headaches.  You have nausea, vomiting, or drowsiness.  You have swelling around your face.  You  have vision problems.  You have a stiff neck.  You have difficulty breathing. MAKE SURE YOU:   Understand these instructions.  Will watch your condition.  Will get help right away if you are not doing well or get worse. Document Released: 02/09/2005 Document Revised: 06/26/2013 Document Reviewed: 02/24/2011 [facility] Patient Information 2015 [place], [place]. This information is not intended to replace advice given to you by your health care provider. Make sure you discuss any questions you have with your health care provider.

## 2014-11-13 NOTE — Assessment & Plan Note (Signed)
Rest, hydration, vitamin C, green tea; antibiotics; discussed risk of C diff, eat yogurt or take probiotics; see AVS

## 2014-11-13 NOTE — Assessment & Plan Note (Signed)
Encouraged patient to quit smoking; she knows she needs to quit; see AVS for smoking cessation tips
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
## 2014-11-14 ENCOUNTER — Telehealth: Payer: Self-pay

## 2014-11-14 DIAGNOSIS — R740 Nonspecific elevation of levels of transaminase and lactic acid dehydrogenase [LDH]: Secondary | ICD-10-CM

## 2014-11-14 DIAGNOSIS — R748 Abnormal levels of other serum enzymes: Secondary | ICD-10-CM

## 2014-11-14 DIAGNOSIS — E1065 Type 1 diabetes mellitus with hyperglycemia: Secondary | ICD-10-CM

## 2014-11-14 DIAGNOSIS — R7401 Elevation of levels of liver transaminase levels: Secondary | ICD-10-CM

## 2014-11-14 NOTE — Telephone Encounter (Signed)
Patient wants to know if she needs to go ahead and get her A1c done here and if so, does she need an appointment or can she come in for just labs?

## 2014-11-14 NOTE — Telephone Encounter (Signed)
Left message to call.

## 2014-11-14 NOTE — Assessment & Plan Note (Signed)
Check A1C while patient is between endocrinologists

## 2014-11-14 NOTE — Telephone Encounter (Signed)
Patient notified, lab appointment scheduled

## 2014-11-14 NOTE — Telephone Encounter (Signed)
I can order the A1C here and her next endocrinologist should be able to access it Her liver enzymes went up per endocrinologist, so we'll recheck those too Orders entered

## 2014-11-15 ENCOUNTER — Other Ambulatory Visit: Payer: No Typology Code available for payment source

## 2014-11-15 DIAGNOSIS — R740 Nonspecific elevation of levels of transaminase and lactic acid dehydrogenase [LDH]: Secondary | ICD-10-CM

## 2014-11-15 DIAGNOSIS — E1065 Type 1 diabetes mellitus with hyperglycemia: Secondary | ICD-10-CM

## 2014-11-15 DIAGNOSIS — R7401 Elevation of levels of liver transaminase levels: Secondary | ICD-10-CM

## 2014-11-15 DIAGNOSIS — R748 Abnormal levels of other serum enzymes: Secondary | ICD-10-CM

## 2014-11-16 ENCOUNTER — Telehealth: Payer: Self-pay | Admitting: Family Medicine

## 2014-11-16 DIAGNOSIS — R748 Abnormal levels of other serum enzymes: Secondary | ICD-10-CM

## 2014-11-16 DIAGNOSIS — R7401 Elevation of levels of liver transaminase levels: Secondary | ICD-10-CM

## 2014-11-16 DIAGNOSIS — R740 Nonspecific elevation of levels of transaminase and lactic acid dehydrogenase [LDH]: Principal | ICD-10-CM

## 2014-11-16 LAB — COMPREHENSIVE METABOLIC PANEL
ALK PHOS: 139 IU/L — AB (ref 39–117)
ALT: 35 IU/L — AB (ref 0–32)
AST: 21 IU/L (ref 0–40)
Albumin/Globulin Ratio: 2 (ref 1.1–2.5)
Albumin: 4.3 g/dL (ref 3.5–5.5)
BUN/Creatinine Ratio: 21 — ABNORMAL HIGH (ref 8–20)
BUN: 17 mg/dL (ref 6–20)
Bilirubin Total: 0.3 mg/dL (ref 0.0–1.2)
CALCIUM: 9.6 mg/dL (ref 8.7–10.2)
CO2: 22 mmol/L (ref 18–29)
Chloride: 97 mmol/L (ref 97–108)
Creatinine, Ser: 0.8 mg/dL (ref 0.57–1.00)
GFR calc Af Amer: 108 mL/min/{1.73_m2} (ref >59)
GFR, EST NON AFRICAN AMERICAN: 94 mL/min/{1.73_m2} (ref >59)
GLOBULIN, TOTAL: 2.2 g/dL (ref 1.5–4.5)
GLUCOSE: 305 mg/dL — AB (ref 65–99)
Potassium: 4.3 mmol/L (ref 3.5–5.2)
Sodium: 136 mmol/L (ref 134–144)
Total Protein: 6.5 g/dL (ref 6.0–8.5)

## 2014-11-16 LAB — HGB A1C W/O EAG: Hgb A1c MFr Bld: 9.4 % — ABNORMAL HIGH (ref 4.8–5.6)

## 2014-11-16 NOTE — Assessment & Plan Note (Signed)
Check GGT and liver US

## 2014-11-16 NOTE — Telephone Encounter (Signed)
Patient notified, lab notified to add on GGT.

## 2014-11-16 NOTE — Telephone Encounter (Signed)
Please let patient know her A1C result; she'll want to follow-up with endocrinologist as we discussed Two of her liver enzymes have gotten better, one went up a little bit; I'd like to get a liver US Please have lab ADD-ON a GGT Have her avoid alcohol and tylenol

## 2014-11-18 LAB — SPECIMEN STATUS REPORT

## 2014-11-18 LAB — GAMMA GT: GGT: 17 IU/L (ref 0–60)

## 2014-11-19 ENCOUNTER — Telehealth: Payer: Self-pay

## 2014-11-19 NOTE — Telephone Encounter (Signed)
Pt called and stated she needs a call back concerning lab work you contacted her about on Friday. Thanks.

## 2014-11-19 NOTE — Telephone Encounter (Signed)
Accidentally closed the encounter.

## 2014-11-19 NOTE — Telephone Encounter (Signed)
I'm not sure; I don't think so, but it's possible; that is a question best asked of her endocrinologist; I personally have not seen that be an issue

## 2014-11-19 NOTE — Telephone Encounter (Signed)
Patient notified

## 2014-11-19 NOTE — Telephone Encounter (Signed)
She got to thinking back and her pen needle size had changed from 68mm to 64mm deep. Since she switched to the 61mm, her sugars have been out of control. She thinks that with the shallower pen needles, the insulin isn't getting "in deep enough". She wonders if the needle change could explain the reason why her sugars have changed.

## 2014-11-20 ENCOUNTER — Telehealth: Payer: Self-pay | Admitting: Family Medicine

## 2014-11-20 NOTE — Telephone Encounter (Signed)
I talked with patient, GGT is totally normal She asked about shorter needles; she thinks the shorter needles are not working as well; she is back on the bigger needles now and blood sugar is good She will still get the [place]; she has not heard back about that yet If she does not hear from my staff by Friday about [place] appt, please call back ---------------------- TIFFANY --> please check on the status of the liver US and contact patient; thank you

## 2014-11-26 NOTE — Telephone Encounter (Signed)
Mel please put this referral in- thanks

## 2014-11-26 NOTE — Telephone Encounter (Signed)
Referral has to be put in by melody

## 2014-11-27 ENCOUNTER — Other Ambulatory Visit: Payer: Self-pay | Admitting: Obstetrics and Gynecology

## 2014-11-27 DIAGNOSIS — E139 Other specified diabetes mellitus without complications: Secondary | ICD-10-CM

## 2014-11-27 NOTE — Telephone Encounter (Signed)
Order was put in otday

## 2014-11-29 ENCOUNTER — Encounter: Payer: Self-pay | Admitting: Family Medicine

## 2014-11-29 ENCOUNTER — Ambulatory Visit
Admission: RE | Admit: 2014-11-29 | Discharge: 2014-11-29 | Disposition: A | Discharge status: Home / Self Care | Payer: No Typology Code available for payment source | Source: Ambulatory Visit | Attending: Family Medicine | Admitting: Family Medicine

## 2014-11-29 DIAGNOSIS — R740 Nonspecific elevation of levels of transaminase and lactic acid dehydrogenase [LDH]: Secondary | ICD-10-CM | POA: Insufficient documentation

## 2014-11-29 DIAGNOSIS — R748 Abnormal levels of other serum enzymes: Secondary | ICD-10-CM | POA: Insufficient documentation

## 2014-11-29 DIAGNOSIS — R7401 Elevation of levels of liver transaminase levels: Secondary | ICD-10-CM

## 2014-12-04 NOTE — Telephone Encounter (Signed)
Tiffany, please see below and follow-up on the liver US

## 2014-12-07 ENCOUNTER — Ambulatory Visit: Payer: No Typology Code available for payment source | Admitting: Obstetrics and Gynecology

## 2014-12-18 ENCOUNTER — Telehealth: Payer: Self-pay | Admitting: Family Medicine

## 2014-12-18 NOTE — Telephone Encounter (Signed)
Pts insurance will only cover levamir and with her blood sugar goes up in the afternoon and at night. She says that her pharmacist has told her that they've had a lot of pts say they'd had the same issue but it can be overrode by the doctor. She would like a call back to know what is best because she is afraid of the long lasting effects.

## 2014-12-19 MED ORDER — INSULIN GLARGINE 100 UNIT/ML SOLOSTAR PEN: 12.0000 [IU] | PEN_INJECTOR | Freq: Every day | SUBCUTANEOUS | Status: DC

## 2014-12-19 NOTE — Telephone Encounter (Signed)
I talked to patient; she has the Levemir and has enough to last until she sees endocrinologist; her blood sugars are going up in the afternoons; she has switched the needles; 424 in the afternoons; Sunday she went to [facility], got test strips; she would like to try switching back to Lantus; Medicap; I encouraged her to be the squeaky wheel and call endo every day or every other day to get in, because I do not treat type 1 diabetes and we want a specialist to help her with that

## 2014-12-19 NOTE — Telephone Encounter (Signed)
She can't be seen by the endocrinologist until Nov. 29th. She doesn't want to wait until then.

## 2014-12-19 NOTE — Telephone Encounter (Signed)
I think patient sees an endocrinologist; she has type 1 diabetes; I think I just did a temporary refill once

## 2014-12-19 NOTE — Telephone Encounter (Signed)
Routing to provider  

## 2014-12-20 ENCOUNTER — Ambulatory Visit (INDEPENDENT_AMBULATORY_CARE_PROVIDER_SITE_OTHER): Payer: No Typology Code available for payment source | Admitting: Obstetrics and Gynecology

## 2014-12-20 ENCOUNTER — Encounter: Payer: Self-pay | Admitting: Obstetrics and Gynecology

## 2014-12-20 ENCOUNTER — Ambulatory Visit: Payer: No Typology Code available for payment source | Admitting: Internal Medicine

## 2014-12-20 VITALS — BP 97/61 | HR 91 | Ht 62.0 in | Wt 115.8 lb

## 2014-12-20 DIAGNOSIS — N63 Unspecified lump in unspecified breast: Secondary | ICD-10-CM

## 2014-12-20 DIAGNOSIS — F329 Major depressive disorder, single episode, unspecified: Secondary | ICD-10-CM

## 2014-12-20 DIAGNOSIS — F32A Depression, unspecified: Secondary | ICD-10-CM

## 2014-12-20 MED ORDER — FLUOXETINE HCL 10 MG PO CAPS: 10.0000 mg | ORAL_CAPSULE | Freq: Every day | ORAL | Status: DC

## 2014-12-20 NOTE — Progress Notes (Signed)
Subjective:     Patient ID: [name], female   DOB: 11-Sep-1975, 39 y.o.   MRN: [id number]  HPI Reports increase anxiety and some depression for last few months Also right breast mass noted 3 days ago, no pain  Review of Systems See above: Increased anxiety about health, easily aggitated, worse since diagnosed with type 1 DM and trying to get numbers under control, then frequent tearful periods.  Right breast lump noted on exam, never had one before, not really tender, but does feel like skin is slightly distorted.    Objective:   Physical Exam A& O x4 Well groomed female in no current distress Breasts: left breast normal without mass, skin or nipple changes or axillary nodes, abnormal mass palpable right outer breast at 7 o'clock- movable, nontender & without skin changes or nipple d/c. Mood and affect appropriate, did become tearful when discussing anxiety    Assessment:     Anxiety with mild depression Right breast mass- fibrocystic in nature     Plan:     Prozac 10 mg at bedtime, will re-evaluate in 3 weeks at next scheduled appointment Breast MMG and u/s ordered  Gennie Alma, CNM

## 2014-12-20 NOTE — Patient Instructions (Signed)
Fibrocystic Breast Changes Fibrocystic breast changes occur when breast ducts become blocked, causing painful, fluid-filled lumps (cysts) to form in the breast. This is a common condition that is noncancerous (benign). It occurs when women go through hormonal changes during their menstrual cycle. Fibrocystic breast changes can affect one or both breasts. CAUSES  The exact cause of fibrocystic breast changes is not known, but it may be related to the female hormones estrogen and progesterone. Family traits that get passed from parent to child (genetics) may also be a factor in some cases. SIGNS AND SYMPTOMS   Tenderness, mild discomfort, or pain.   Swelling.   Rope-like feeling when touching the breast.   Lumpy breast, one or both sides.   Changes in breast size, especially before (larger) and after (smaller) the menstrual period.   Green or dark brown nipple discharge (not blood).  Symptoms are usually worse before menstrual periods start and get better toward the end of the menstrual period.  DIAGNOSIS  To make a diagnosis, your health care provider will ask you questions and perform a physical exam of your breasts. The health care provider may recommend other tests that can examine inside your breasts, such as:  A breast X-ray (mammogram).   Ultrasonography.  An MRI.  If something more than fibrocystic breast changes is suspected, your health care provider may take a breast tissue sample (breast biopsy) to examine. TREATMENT  Often, treatment is not needed. Your health care provider may recommend over-the-counter pain relievers to help lessen pain or discomfort caused by the fibrocystic breast changes. You may also be asked to change your diet to limit or stop eating foods or drinking beverages that contain caffeine. Foods and beverages that contain caffeine include chocolate, soda, coffee, and tea. Reducing sugar and fat in your diet may also help. Your health care provider  may also recommend:  Fine needle aspiration to remove fluid from a cyst that is causing pain.   Surgery to remove a large, persistent, and tender cyst. HOME CARE INSTRUCTIONS   Examine your breasts after every menstrual period. If you do not have menstrual periods, check your breasts the first day of every month. Feel for changes, such as more tenderness, a new growth, a change in breast size, or a change in a lump that has always been there.   Only take over-the-counter or prescription medicine as directed by your health care provider.   Wear a well-fitted support or sports bra, especially when exercising.   Decrease or avoid caffeine, fat, and sugar in your diet as directed by your health care provider.  SEEK MEDICAL CARE IF:   You have fluid leaking (discharge) from your nipples, especially bloody discharge.   You have new lumps or bumps in the breast.   Your breast or breasts become enlarged, red, and painful.   You have areas of your breast that pucker in.   Your nipples appear flat or indented.    This information is not intended to replace advice given to you by your health care provider. Make sure you discuss any questions you have with your health care provider.   Document Released: 11/26/2005 Document Revised: 10/31/2014 Document Reviewed: 07/31/2012 Elsevier Interactive Patient Education 2016 [employer]   Persistent Depressive Disorder Persistent Depressive Disorder (PDD), also known as long-term (chronic) depression, is depression that lasts for at least 2 years in adults and at least 1 year in children and adolescents. PDD varies in severity from mild chronic depression (previously called dysthymia)  to more severe chronic depression (previously called chronic major depressive disorder).  Mild PDD usually starts in the teenage years. People with mild PDD usually enjoy some or most aspects of their life even though they have low self-esteem and some physical  symptoms of depression. Mild PDD can interfere with personal relationships, performance at school or work, and physical health. Mild PDD is not associated with thoughts of harming oneself. However, more than one half of people with mild PDD develop more severe PDD. More severe PDD usually starts in middle adulthood. It often is triggered by a negative life event (for example, loss of a loved one or loss of a job). People with more severe PDD usually have trouble enjoying life and having fun. More severe PDD usually interferes with personal relationships, school or work, and physical health. More severe PDD is often associated with thoughts or attempts of harming oneself or killing oneself (suicide). Some people with the most severe PDD lose touch with reality (psychosis). They may have false beliefs that others are dangerous (paranoid delusions). They may also see or hear things that are not real (hallucinations). CAUSES The exact cause of PDD is not known. However, often one or more of the following factors are associated with people with PDD:   A family history of depression.  Abnormally low levels of certain brain chemicals.   Traumatic events in childhood, especially the loss of a parent or emotional abuse.  Being under a lot of stress (in the form of upsetting life experiences or losses).   History of a chronic physical illness. SYMPTOMS Symptoms of PDD vary according to the severity. A diagnosis of mild PDD requires depressed mood, most of the day more days than not, and two or more of the following symptoms:   Eating too much or not eating enough.  Low energy or fatigue.   Sleeping too much or not sleeping enough.   Feelings of hopelessness.   Difficulty thinking clearly or making decisions.   Low self-esteem.  People with more severe PDD experience depressed mood, most of the day nearly every day, and also have one or more of the following symptoms:  Inability to have fun  or experience pleasure.  Feeling of restless and anxiousness rather than tiredness or fatigue.   Feelings of worthlessness or excessive guilt.  Recurring thoughts of death or suicidal ideation with or without a specific plan. DIAGNOSIS The diagnosis of PDD is based on the length of your depressive symptoms. Your caregiver will ask you questions to determine the severity of your chronic depressive symptoms. Your caregiver also may order blood tests and ask questions about your medical history and use of drugs and alcohol to rule out specific causes of your depressive symptoms. Your caregiver may refer you to a mental health care professional for further evaluation and treatment. TREATMENT Most people with PDD get better with treatment. Treatment for PDD may include medicine or talk therapy (psychotherapy). People with mild PDD often get better with psychotherapy alone. People with more severe PDD usually do better with a combination of medicine and psychotherapy.   This information is not intended to replace advice given to you by your health care provider. Make sure you discuss any questions you have with your health care provider.   Document Released: 01/27/2012 Document Revised: 03/02/2014 Document Reviewed: 01/27/2012 Elsevier Interactive Patient Education [employer].

## 2014-12-31 ENCOUNTER — Ambulatory Visit
Admission: RE | Admit: 2014-12-31 | Discharge: 2014-12-31 | Disposition: A | Discharge status: Home / Self Care | Payer: No Typology Code available for payment source | Source: Ambulatory Visit | Attending: Obstetrics and Gynecology | Admitting: Obstetrics and Gynecology

## 2014-12-31 ENCOUNTER — Other Ambulatory Visit: Payer: Self-pay | Admitting: Obstetrics and Gynecology

## 2014-12-31 DIAGNOSIS — N63 Unspecified lump in unspecified breast: Secondary | ICD-10-CM

## 2014-12-31 DIAGNOSIS — R928 Other abnormal and inconclusive findings on diagnostic imaging of breast: Secondary | ICD-10-CM | POA: Diagnosis not present

## 2014-12-31 DIAGNOSIS — N6001 Solitary cyst of right breast: Secondary | ICD-10-CM | POA: Insufficient documentation

## 2015-01-08 ENCOUNTER — Ambulatory Visit (INDEPENDENT_AMBULATORY_CARE_PROVIDER_SITE_OTHER): Payer: No Typology Code available for payment source | Admitting: Obstetrics and Gynecology

## 2015-01-08 ENCOUNTER — Encounter: Payer: Self-pay | Admitting: Obstetrics and Gynecology

## 2015-01-08 VITALS — BP 110/72 | HR 86 | Wt 116.6 lb

## 2015-01-08 DIAGNOSIS — Z30431 Encounter for routine checking of intrauterine contraceptive device: Secondary | ICD-10-CM | POA: Diagnosis not present

## 2015-01-08 DIAGNOSIS — N924 Excessive bleeding in the premenopausal period: Secondary | ICD-10-CM | POA: Diagnosis not present

## 2015-01-08 NOTE — Progress Notes (Signed)
Subjective:     Patient ID: [name], female   DOB: 12-02-1975, 39 y.o.   MRN: [id number]  HPI Had mirena placed for menstrual control and pregnancy prevention, with anemia.  Review of Systems Reports only spotting since insertion, no heavy bleeding and denies any concerns. Seen last month for breast lump and had mammagram- reviewed findings of benign fibrocystic tissue bilaterally    Objective:   Physical Exam A&O x4 Well groomed female in no distress Pelvic exam: normal external genitalia, vulva, vagina, cervix, uterus and adnexa, IUD string noted with scant brown spotting at cervix.    Assessment:     IUD survellence Family h/o of breast cancer- 6 month breast/ovary screen.     Plan:     RTC in June for AE or prn.  Melody Trudee Kuster, CNM

## 2015-01-09 LAB — COMPREHENSIVE METABOLIC PANEL
ALBUMIN: 4.2 g/dL (ref 3.5–5.5)
ALK PHOS: 119 IU/L — AB (ref 39–117)
ALT: 17 IU/L (ref 0–32)
AST: 15 IU/L (ref 0–40)
Albumin/Globulin Ratio: 1.8 (ref 1.1–2.5)
BILIRUBIN TOTAL: 0.2 mg/dL (ref 0.0–1.2)
BUN / CREAT RATIO: 28 — AB (ref 8–20)
BUN: 20 mg/dL (ref 6–20)
CHLORIDE: 100 mmol/L (ref 97–106)
CO2: 25 mmol/L (ref 18–29)
CREATININE: 0.71 mg/dL (ref 0.57–1.00)
Calcium: 9.3 mg/dL (ref 8.7–10.2)
GFR calc non Af Amer: 108 mL/min/{1.73_m2} (ref >59)
GFR, EST AFRICAN AMERICAN: 124 mL/min/{1.73_m2} (ref >59)
GLOBULIN, TOTAL: 2.4 g/dL (ref 1.5–4.5)
Glucose: 116 mg/dL — ABNORMAL HIGH (ref 65–99)
Potassium: 3.9 mmol/L (ref 3.5–5.2)
SODIUM: 140 mmol/L (ref 136–144)
Total Protein: 6.6 g/dL (ref 6.0–8.5)

## 2015-01-09 LAB — CBC WITH DIFFERENTIAL/PLATELET
BASOS: 0 %
Basophils Absolute: 0 10*3/uL (ref 0.0–0.2)
EOS (ABSOLUTE): 1 10*3/uL — ABNORMAL HIGH (ref 0.0–0.4)
EOS: 8 %
HEMATOCRIT: 42.6 % (ref 34.0–46.6)
Hemoglobin: 14.2 g/dL (ref 11.1–15.9)
Immature Grans (Abs): 0 10*3/uL (ref 0.0–0.1)
Immature Granulocytes: 0 %
LYMPHS ABS: 3.8 10*3/uL — AB (ref 0.7–3.1)
Lymphs: 30 %
MCH: 31.2 pg (ref 26.6–33.0)
MCHC: 33.3 g/dL (ref 31.5–35.7)
MCV: 94 fL (ref 79–97)
MONOS ABS: 0.6 10*3/uL (ref 0.1–0.9)
Monocytes: 4 %
NEUTROS ABS: 7.2 10*3/uL — AB (ref 1.4–7.0)
Neutrophils: 58 %
Platelets: 325 10*3/uL (ref 150–379)
RBC: 4.55 x10E6/uL (ref 3.77–5.28)
RDW: 13.1 % (ref 12.3–15.4)
WBC: 12.6 10*3/uL — ABNORMAL HIGH (ref 3.4–10.8)

## 2015-01-09 LAB — IRON: Iron: 66 ug/dL (ref 27–159)

## 2015-02-19 ENCOUNTER — Telehealth: Payer: Self-pay | Admitting: Family Medicine

## 2015-02-19 MED ORDER — OSELTAMIVIR PHOSPHATE 75 MG PO CAPS: 75.0000 mg | ORAL_CAPSULE | Freq: Every day | ORAL | Status: DC

## 2015-02-19 NOTE — Telephone Encounter (Signed)
I talked with patient, start tamiflu once a day; if she gets sick, call us back and we'll convert to twice a day

## 2015-02-19 NOTE — Telephone Encounter (Signed)
Dr. Sanda Klein, should she be treated with Tamiflu for precautionary?

## 2015-02-19 NOTE — Telephone Encounter (Signed)
Pts husband was just diagnosed with the flu, she is concerned being a type I diabetic that she will get sick easily.   Would like to know what she can do precautionary to keep from getting sick.  Please call her.

## 2015-05-17 ENCOUNTER — Encounter: Payer: Self-pay | Admitting: Obstetrics and Gynecology

## 2015-05-17 ENCOUNTER — Ambulatory Visit: Payer: No Typology Code available for payment source | Admitting: Unknown Physician Specialty

## 2015-05-17 ENCOUNTER — Ambulatory Visit (INDEPENDENT_AMBULATORY_CARE_PROVIDER_SITE_OTHER): Payer: Managed Care, Other (non HMO) | Admitting: Obstetrics and Gynecology

## 2015-05-17 VITALS — BP 103/67 | HR 74 | Ht 62.0 in | Wt 113.9 lb

## 2015-05-17 DIAGNOSIS — R1032 Left lower quadrant pain: Secondary | ICD-10-CM

## 2015-05-17 LAB — POCT URINALYSIS DIPSTICK
BILIRUBIN UA: NEGATIVE
Blood, UA: NEGATIVE
GLUCOSE UA: 500
Ketones, UA: NEGATIVE
LEUKOCYTES UA: NEGATIVE
NITRITE UA: NEGATIVE
PH UA: 6.5
Protein, UA: 1
Spec Grav, UA: 1.02
Urobilinogen, UA: 0.2

## 2015-05-17 NOTE — Progress Notes (Signed)
Subjective:     Patient ID: [name], female   DOB: [birth year], 40 y.o.   MRN: [id number]  HPI Reports LLQ pain and generalized abdominal pain x5-7 days, worse last night. Had 1 episode of vaginal bleeding 3 nights ago. Has not had bleeding since Mirena placed in December. Also has noticed some heartburn. And increased urinary frequency.  Review of Systems See above    Objective:   Physical Exam A&O x4  well groomed female Blood pressure 103/67, pulse 74, height 5\' 2"  (1.575 m), weight 113 lb 14.4 oz (51.665 kg). Urine dipstick shows negative for all components except sugar. Abdomen soft, BS normal all four quadrants, slightly tender in LLQ Pelvic exam: normal external genitalia, vulva, vagina, cervix, uterus and adnexa, CERVIX: normal appearing cervix without discharge or lesions, cervical motion tenderness absent, IUD string noted, ADNEXA: tenderness left. With slight fullness noted    Assessment:     LLQ pain Heartburn Urinary frequency     Plan:     Will send urine for culture and call with results. Discussed treatment with [facility]Ps for suspected ovarian cyst verses expectant management- patient desires to watch for now, but will call if pain persists or worsens for a pelvic ultrasound. To use antacids as needed. RTC prn   Melody Jetmore, CNM

## 2015-05-21 LAB — URINE CULTURE: Organism ID, Bacteria: NO GROWTH

## 2015-08-22 ENCOUNTER — Ambulatory Visit (INDEPENDENT_AMBULATORY_CARE_PROVIDER_SITE_OTHER): Payer: Managed Care, Other (non HMO) | Admitting: Obstetrics and Gynecology

## 2015-08-22 ENCOUNTER — Encounter: Payer: Self-pay | Admitting: Obstetrics and Gynecology

## 2015-08-22 VITALS — BP 105/65 | HR 75 | Ht 62.0 in | Wt 115.8 lb

## 2015-08-22 DIAGNOSIS — Z01419 Encounter for gynecological examination (general) (routine) without abnormal findings: Secondary | ICD-10-CM | POA: Diagnosis not present
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
## 2015-08-22 NOTE — Patient Instructions (Signed)
Place annual gynecologic exam patient instructions here.

## 2016-07-17 ENCOUNTER — Ambulatory Visit (INDEPENDENT_AMBULATORY_CARE_PROVIDER_SITE_OTHER): Payer: Managed Care, Other (non HMO) | Admitting: Obstetrics and Gynecology

## 2016-07-17 ENCOUNTER — Encounter: Payer: Self-pay | Admitting: Obstetrics and Gynecology

## 2016-07-17 VITALS — BP 135/83 | HR 93 | Ht 62.0 in | Wt 124.7 lb

## 2016-07-17 DIAGNOSIS — N6011 Diffuse cystic mastopathy of right breast: Secondary | ICD-10-CM

## 2016-07-17 DIAGNOSIS — N9089 Other specified noninflammatory disorders of vulva and perineum: Secondary | ICD-10-CM | POA: Diagnosis not present

## 2016-07-17 DIAGNOSIS — N6012 Diffuse cystic mastopathy of left breast: Secondary | ICD-10-CM | POA: Diagnosis not present

## 2016-07-17 MED ORDER — ACYCLOVIR 400 MG PO TABS: 400.0000 mg | ORAL_TABLET | Freq: Three times a day (TID) | ORAL | 2 refills | Status: DC

## 2016-07-17 NOTE — Progress Notes (Signed)
Subjective:     Patient ID: [name], female   DOB: Jan 13, 1976, 41 y.o.   MRN: [id number]  HPI Spot on outer right breast that is tender for 7-10 days, both breast have been very tender for a few weeks. Is past due for follow up mammogram  Also has new lesion on vulva after having sex two weeks ago with spouse. Also feels 'achy' in pelvic area in general. Denies fever. 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  Review of Systems Negative except stated above in HPI    Objective:   Physical Exam A&O x4 Well groomed female in no distress Blood pressure 135/83, pulse 93, height 5\' 2"  (1.575 m), weight 124 lb 11.2 oz (56.6 kg). Breasts: symmetric fibrous changes in both upper outer quadrants, tender on palpation on right side. Pelvic exam: VULVA: vulvar lesion 14mm on inner right labia near clitorus, VAGINA: normal appearing vagina with normal color and discharge, no lesions.    Assessment:     Fibrocystic breast changes bilateral Right vulvar lesion HSV +    Plan:     Ordered MMG and bilaretal breast ultrasound HSV culture obtained and sent. Will start acyclovir 800mg  tid x7 days, and then repeat with future flare ups.  RTC as needed.   Melody [place], CNM

## 2016-07-20 LAB — HERPES SIMPLEX VIRUS CULTURE

## 2016-07-22 NOTE — Addendum Note (Signed)
Addended by: Elouise Munroe on: 07/22/2016 02:45 PM   Modules accepted: Orders

## 2016-07-30 ENCOUNTER — Ambulatory Visit
Admission: RE | Admit: 2016-07-30 | Discharge: 2016-07-30 | Disposition: A | Discharge status: Home / Self Care | Payer: Managed Care, Other (non HMO) | Source: Ambulatory Visit | Attending: Obstetrics and Gynecology | Admitting: Obstetrics and Gynecology

## 2016-07-30 DIAGNOSIS — N6011 Diffuse cystic mastopathy of right breast: Secondary | ICD-10-CM | POA: Insufficient documentation

## 2016-07-30 DIAGNOSIS — N6012 Diffuse cystic mastopathy of left breast: Secondary | ICD-10-CM | POA: Insufficient documentation

## 2016-08-28 ENCOUNTER — Encounter: Payer: Managed Care, Other (non HMO) | Admitting: Obstetrics and Gynecology

## 2016-11-05 ENCOUNTER — Encounter: Payer: Self-pay | Admitting: Obstetrics and Gynecology

## 2016-11-05 ENCOUNTER — Ambulatory Visit (INDEPENDENT_AMBULATORY_CARE_PROVIDER_SITE_OTHER): Payer: 59 | Admitting: Obstetrics and Gynecology

## 2016-11-05 VITALS — BP 114/67 | HR 76 | Ht 62.0 in | Wt 124.2 lb

## 2016-11-05 DIAGNOSIS — Z01419 Encounter for gynecological examination (general) (routine) without abnormal findings: Secondary | ICD-10-CM | POA: Diagnosis not present

## 2016-11-05 DIAGNOSIS — F17200 Nicotine dependence, unspecified, uncomplicated: Secondary | ICD-10-CM | POA: Diagnosis not present

## 2016-11-05 NOTE — Patient Instructions (Signed)
Preventive Care 18-39 Years, Female Preventive care refers to lifestyle choices and visits with your health care provider that can promote health and wellness. What does preventive care include?  A yearly physical exam. This is also called an annual well check.  Dental exams once or twice a year.  Routine eye exams. Ask your health care provider how often you should have your eyes checked.  Personal lifestyle choices, including: ? Daily care of your teeth and gums. ? Regular physical activity. ? Eating a healthy diet. ? Avoiding tobacco and drug use. ? Limiting alcohol use. ? Practicing safe sex. ? Taking vitamin and mineral supplements as recommended by your health care provider. What happens during an annual well check? The services and screenings done by your health care provider during your annual well check will depend on your age, overall health, lifestyle risk factors, and family history of disease. Counseling Your health care provider may ask you questions about your:  Alcohol use.  Tobacco use.  Drug use.  Emotional well-being.  Home and relationship well-being.  Sexual activity.  Eating habits.  Work and work Statistician.  Method of birth control.  Menstrual cycle.  Pregnancy history.  Screening You may have the following tests or measurements:  Height, weight, and BMI.  Diabetes screening. This is done by checking your blood sugar (glucose) after you have not eaten for a while (fasting).  Blood pressure.  Lipid and cholesterol levels. These may be checked every 5 years starting at age 38.  Skin check.  Hepatitis C blood test.  Hepatitis B blood test.  Sexually transmitted disease (STD) testing.  BRCA-related cancer screening. This may be done if you have a family history of breast, ovarian, tubal, or peritoneal cancers.  Pelvic exam and Pap test. This may be done every 3 years starting at age 38. Starting at age 30, this may be done  every 5 years if you have a Pap test in combination with an HPV test.  Discuss your test results, treatment options, and if necessary, the need for more tests with your health care provider. Vaccines Your health care provider may recommend certain vaccines, such as:  Influenza vaccine. This is recommended every year.  Tetanus, diphtheria, and acellular pertussis (Tdap, Td) vaccine. You may need a Td booster every 10 years.  Varicella vaccine. You may need this if you have not been vaccinated.  HPV vaccine. If you are 39 or younger, you may need three doses over 6 months.  Measles, mumps, and rubella (MMR) vaccine. You may need at least one dose of MMR. You may also need a second dose.  Pneumococcal 13-valent conjugate (PCV13) vaccine. You may need this if you have certain conditions and were not previously vaccinated.  Pneumococcal polysaccharide (PPSV23) vaccine. You may need one or two doses if you smoke cigarettes or if you have certain conditions.  Meningococcal vaccine. One dose is recommended if you are age 68-21 years and a first-year college student living in a residence hall, or if you have one of several medical conditions. You may also need additional booster doses.  Hepatitis A vaccine. You may need this if you have certain conditions or if you travel or work in places where you may be exposed to hepatitis A.  Hepatitis B vaccine. You may need this if you have certain conditions or if you travel or work in places where you may be exposed to hepatitis B.  Haemophilus influenzae type b (Hib) vaccine. You may need this  if you have certain risk factors.  Talk to your health care provider about which screenings and vaccines you need and how often you need them. This information is not intended to replace advice given to you by your health care provider. Make sure you discuss any questions you have with your health care provider. Document Released: 04/07/2001 Document Revised:  10/30/2015 Document Reviewed: 12/11/2014 Elsevier Interactive Patient Education  2017 Elsevier Inc.  
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
## 2016-11-06 ENCOUNTER — Other Ambulatory Visit: Payer: Self-pay | Admitting: Obstetrics and Gynecology

## 2016-11-10 LAB — CYTOLOGY - PAP

## 2016-11-16 ENCOUNTER — Telehealth: Payer: Self-pay | Admitting: Obstetrics and Gynecology

## 2016-11-16 NOTE — Telephone Encounter (Signed)
Pt wanted to discuss her lab results

## 2016-11-16 NOTE — Telephone Encounter (Signed)
Patient called and stated that she would like a call form Amy when she is available to do so. No other information was disclosed. Please advise.

## 2016-11-20 NOTE — Telephone Encounter (Signed)
Pt request a call back from Amy b/c she was expecting a follow up call from speaking with Amy on Monday. Please advise. Thanks TNP

## 2016-11-20 NOTE — Telephone Encounter (Signed)
Spoke with pt

## 2016-12-17 ENCOUNTER — Telehealth: Payer: Self-pay | Admitting: Obstetrics and Gynecology

## 2016-12-17 NOTE — Telephone Encounter (Signed)
Patient called and stated that she would like to speak to Amy in regards to her last appointment she had, she has a few questions. Please advise.

## 2016-12-17 NOTE — Telephone Encounter (Signed)
Called pt.

## 2017-11-11 ENCOUNTER — Ambulatory Visit (INDEPENDENT_AMBULATORY_CARE_PROVIDER_SITE_OTHER): Payer: 59 | Admitting: Obstetrics and Gynecology

## 2017-11-11 ENCOUNTER — Encounter: Payer: Self-pay | Admitting: Obstetrics and Gynecology

## 2017-11-11 VITALS — BP 103/74 | HR 92 | Ht 62.0 in | Wt 119.3 lb

## 2017-11-11 DIAGNOSIS — Z01419 Encounter for gynecological examination (general) (routine) without abnormal findings: Secondary | ICD-10-CM

## 2017-11-11 DIAGNOSIS — R300 Dysuria: Secondary | ICD-10-CM

## 2017-11-11 LAB — POCT URINALYSIS DIPSTICK
Bilirubin, UA: NEGATIVE
GLUCOSE UA: NEGATIVE
KETONES UA: NEGATIVE
Nitrite, UA: NEGATIVE
Protein, UA: NEGATIVE
RBC UA: NEGATIVE
SPEC GRAV UA: 1.015 (ref 1.010–1.025)
Urobilinogen, UA: 0.2 E.U./dL
pH, UA: 6 (ref 5.0–8.0)
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
## 2017-11-11 NOTE — Patient Instructions (Signed)
Preventive Care 18-39 Years, Female Preventive care refers to lifestyle choices and visits with your health care provider that can promote health and wellness. What does preventive care include?  A yearly physical exam. This is also called an annual well check.  Dental exams once or twice a year.  Routine eye exams. Ask your health care provider how often you should have your eyes checked.  Personal lifestyle choices, including: ? Daily care of your teeth and gums. ? Regular physical activity. ? Eating a healthy diet. ? Avoiding tobacco and drug use. ? Limiting alcohol use. ? Practicing safe sex. ? Taking vitamin and mineral supplements as recommended by your health care provider. What happens during an annual well check? The services and screenings done by your health care provider during your annual well check will depend on your age, overall health, lifestyle risk factors, and family history of disease. Counseling Your health care provider may ask you questions about your:  Alcohol use.  Tobacco use.  Drug use.  Emotional well-being.  Home and relationship well-being.  Sexual activity.  Eating habits.  Work and work Statistician.  Method of birth control.  Menstrual cycle.  Pregnancy history.  Screening You may have the following tests or measurements:  Height, weight, and BMI.  Diabetes screening. This is done by checking your blood sugar (glucose) after you have not eaten for a while (fasting).  Blood pressure.  Lipid and cholesterol levels. These may be checked every 5 years starting at age 38.  Skin check.  Hepatitis C blood test.  Hepatitis B blood test.  Sexually transmitted disease (STD) testing.  BRCA-related cancer screening. This may be done if you have a family history of breast, ovarian, tubal, or peritoneal cancers.  Pelvic exam and Pap test. This may be done every 3 years starting at age 38. Starting at age 30, this may be done  every 5 years if you have a Pap test in combination with an HPV test.  Discuss your test results, treatment options, and if necessary, the need for more tests with your health care provider. Vaccines Your health care provider may recommend certain vaccines, such as:  Influenza vaccine. This is recommended every year.  Tetanus, diphtheria, and acellular pertussis (Tdap, Td) vaccine. You may need a Td booster every 10 years.  Varicella vaccine. You may need this if you have not been vaccinated.  HPV vaccine. If you are 39 or younger, you may need three doses over 6 months.  Measles, mumps, and rubella (MMR) vaccine. You may need at least one dose of MMR. You may also need a second dose.  Pneumococcal 13-valent conjugate (PCV13) vaccine. You may need this if you have certain conditions and were not previously vaccinated.  Pneumococcal polysaccharide (PPSV23) vaccine. You may need one or two doses if you smoke cigarettes or if you have certain conditions.  Meningococcal vaccine. One dose is recommended if you are age 68-21 years and a first-year college student living in a residence hall, or if you have one of several medical conditions. You may also need additional booster doses.  Hepatitis A vaccine. You may need this if you have certain conditions or if you travel or work in places where you may be exposed to hepatitis A.  Hepatitis B vaccine. You may need this if you have certain conditions or if you travel or work in places where you may be exposed to hepatitis B.  Haemophilus influenzae type b (Hib) vaccine. You may need this  if you have certain risk factors.  Talk to your health care provider about which screenings and vaccines you need and how often you need them. This information is not intended to replace advice given to you by your health care provider. Make sure you discuss any questions you have with your health care provider. Document Released: 04/07/2001 Document Revised:  10/30/2015 Document Reviewed: 12/11/2014 Elsevier Interactive Patient Education  2018 Elsevier Inc.  

## 2017-11-13 LAB — URINE CULTURE

## 2018-11-16 ENCOUNTER — Encounter: Payer: Self-pay | Admitting: Obstetrics and Gynecology

## 2018-11-16 ENCOUNTER — Ambulatory Visit (INDEPENDENT_AMBULATORY_CARE_PROVIDER_SITE_OTHER): Payer: 59 | Admitting: Obstetrics and Gynecology

## 2018-11-16 ENCOUNTER — Other Ambulatory Visit: Payer: Self-pay

## 2018-11-16 VITALS — BP 108/79 | HR 81 | Ht 62.0 in | Wt 121.2 lb

## 2018-11-16 DIAGNOSIS — Z01419 Encounter for gynecological examination (general) (routine) without abnormal findings: Secondary | ICD-10-CM | POA: Diagnosis not present

## 2018-11-16 DIAGNOSIS — N6019 Diffuse cystic mastopathy of unspecified breast: Secondary | ICD-10-CM

## 2018-11-16 DIAGNOSIS — Z30431 Encounter for routine checking of intrauterine contraceptive device: Secondary | ICD-10-CM
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
## 2018-11-17 LAB — LIPID PANEL
Chol/HDL Ratio: 2.4 ratio (ref 0.0–4.4)
Cholesterol, Total: 139 mg/dL (ref 100–199)
HDL: 59 mg/dL (ref >39)
LDL Chol Calc (NIH): 72 mg/dL (ref 0–99)
Triglycerides: 32 mg/dL (ref 0–149)
VLDL Cholesterol Cal: 8 mg/dL (ref 5–40)

## 2018-11-17 LAB — COMPREHENSIVE METABOLIC PANEL
ALT: 13 IU/L (ref 0–32)
AST: 16 IU/L (ref 0–40)
Albumin/Globulin Ratio: 1.5 (ref 1.2–2.2)
Albumin: 3.8 g/dL (ref 3.8–4.8)
Alkaline Phosphatase: 109 IU/L (ref 39–117)
BUN/Creatinine Ratio: 16 (ref 9–23)
BUN: 13 mg/dL (ref 6–24)
Bilirubin Total: 0.3 mg/dL (ref 0.0–1.2)
CO2: 24 mmol/L (ref 20–29)
Calcium: 8.9 mg/dL (ref 8.7–10.2)
Chloride: 101 mmol/L (ref 96–106)
Creatinine, Ser: 0.8 mg/dL (ref 0.57–1.00)
GFR calc Af Amer: 105 mL/min/{1.73_m2} (ref >59)
GFR calc non Af Amer: 91 mL/min/{1.73_m2} (ref >59)
Globulin, Total: 2.6 g/dL (ref 1.5–4.5)
Glucose: 211 mg/dL — ABNORMAL HIGH (ref 65–99)
Potassium: 4 mmol/L (ref 3.5–5.2)
Sodium: 139 mmol/L (ref 134–144)
Total Protein: 6.4 g/dL (ref 6.0–8.5)

## 2018-11-17 LAB — B12 AND FOLATE PANEL
Folate: 9.9 ng/mL (ref >3.0)
Vitamin B-12: 1836 pg/mL — ABNORMAL HIGH (ref 232–1245)

## 2018-11-17 LAB — HEMOGLOBIN A1C
Est. average glucose Bld gHb Est-mCnc: 220 mg/dL
Hgb A1c MFr Bld: 9.3 % — ABNORMAL HIGH (ref 4.8–5.6)

## 2018-11-17 LAB — CBC
Hematocrit: 41.1 % (ref 34.0–46.6)
Hemoglobin: 13.9 g/dL (ref 11.1–15.9)
MCH: 32 pg (ref 26.6–33.0)
MCHC: 33.8 g/dL (ref 31.5–35.7)
MCV: 95 fL (ref 79–97)
Platelets: 305 10*3/uL (ref 150–450)
RBC: 4.35 x10E6/uL (ref 3.77–5.28)
RDW: 11.9 % (ref 11.7–15.4)
WBC: 12.4 10*3/uL — ABNORMAL HIGH (ref 3.4–10.8)

## 2018-11-17 LAB — TSH: TSH: 1.09 u[IU]/mL (ref 0.450–4.500)

## 2018-11-17 LAB — VITAMIN D 25 HYDROXY (VIT D DEFICIENCY, FRACTURES): Vit D, 25-Hydroxy: 44 ng/mL (ref 30.0–100.0)

## 2018-12-21 ENCOUNTER — Ambulatory Visit
Admission: RE | Admit: 2018-12-21 | Discharge: 2018-12-21 | Disposition: A | Discharge status: Home / Self Care | Payer: 59 | Source: Ambulatory Visit | Attending: Obstetrics and Gynecology | Admitting: Obstetrics and Gynecology

## 2018-12-21 DIAGNOSIS — Z01419 Encounter for gynecological examination (general) (routine) without abnormal findings: Secondary | ICD-10-CM

## 2018-12-21 DIAGNOSIS — Z1231 Encounter for screening mammogram for malignant neoplasm of breast: Secondary | ICD-10-CM | POA: Insufficient documentation

## 2019-11-16 ENCOUNTER — Other Ambulatory Visit: Payer: Self-pay | Admitting: Obstetrics and Gynecology

## 2020-02-02 ENCOUNTER — Telehealth: Payer: Self-pay

## 2020-02-02 NOTE — Telephone Encounter (Signed)
mychart message sent to patient

## 2020-02-27 ENCOUNTER — Other Ambulatory Visit: Payer: Self-pay

## 2020-02-27 ENCOUNTER — Encounter: Payer: Self-pay | Admitting: Certified Nurse Midwife

## 2020-02-27 ENCOUNTER — Ambulatory Visit (INDEPENDENT_AMBULATORY_CARE_PROVIDER_SITE_OTHER): Payer: No Typology Code available for payment source | Admitting: Certified Nurse Midwife

## 2020-02-27 ENCOUNTER — Other Ambulatory Visit (HOSPITAL_COMMUNITY)
Admission: RE | Admit: 2020-02-27 | Discharge: 2020-02-27 | Disposition: A | Discharge status: Home / Self Care | Payer: No Typology Code available for payment source | Source: Ambulatory Visit | Attending: Certified Nurse Midwife | Admitting: Certified Nurse Midwife

## 2020-02-27 VITALS — BP 107/71 | HR 86 | Ht 62.0 in | Wt 119.0 lb

## 2020-02-27 DIAGNOSIS — Z1231 Encounter for screening mammogram for malignant neoplasm of breast: Secondary | ICD-10-CM

## 2020-02-27 DIAGNOSIS — N898 Other specified noninflammatory disorders of vagina: Secondary | ICD-10-CM | POA: Diagnosis present

## 2020-02-27 DIAGNOSIS — R8290 Unspecified abnormal findings in urine: Secondary | ICD-10-CM | POA: Diagnosis not present

## 2020-02-27 LAB — POCT URINALYSIS DIPSTICK
Bilirubin, UA: NEGATIVE
Blood, UA: NEGATIVE
Glucose, UA: POSITIVE — AB
Leukocytes, UA: NEGATIVE
Nitrite, UA: NEGATIVE
Protein, UA: NEGATIVE
Spec Grav, UA: 1.025 (ref 1.010–1.025)
Urobilinogen, UA: 0.2 E.U./dL
pH, UA: 5 (ref 5.0–8.0)

## 2020-02-27 NOTE — Patient Instructions (Signed)
Urinary Tract Infection, Adult A urinary tract infection (UTI) is an infection of any part of the urinary tract. The urinary tract includes:  The kidneys.  The ureters.  The bladder.  The urethra. These organs make, store, and get rid of pee (urine) in the body. What are the causes? This is caused by germs (bacteria) in your genital area. These germs grow and cause swelling (inflammation) of your urinary tract. What increases the risk? You are more likely to develop this condition if:  You have a small, thin tube (catheter) to drain pee.  You cannot control when you pee or poop (incontinence).  You are female, and: ? You use these methods to prevent pregnancy:  A medicine that kills sperm (spermicide).  A device that blocks sperm (diaphragm). ? You have low levels of a female hormone (estrogen). ? You are pregnant.  You have genes that add to your risk.  You are sexually active.  You take antibiotic medicines.  You have trouble peeing because of: ? A prostate that is bigger than normal, if you are female. ? A blockage in the part of your body that drains pee from the bladder (urethra). ? A kidney stone. ? A nerve condition that affects your bladder (neurogenic bladder). ? Not getting enough to drink. ? Not peeing often enough.  You have other conditions, such as: ? Diabetes. ? A weak disease-fighting system (immune system). ? Sickle cell disease. ? Gout. ? Injury of the spine. What are the signs or symptoms? Symptoms of this condition include:  Needing to pee right away (urgently).  Peeing often.  Peeing small amounts often.  Pain or burning when peeing.  Blood in the pee.  Pee that smells bad or not like normal.  Trouble peeing.  Pee that is cloudy.  Fluid coming from the vagina, if you are female.  Pain in the belly or lower back. Other symptoms include:  Throwing up (vomiting).  No urge to eat.  Feeling mixed up (confused).  Being tired  and grouchy (irritable).  A fever.  Watery poop (diarrhea). How is this treated? This condition may be treated with:  Antibiotic medicine.  Other medicines.  Drinking enough water. Follow these instructions at home:  Medicines  Take over-the-counter and prescription medicines only as told by your doctor.  If you were prescribed an antibiotic medicine, take it as told by your doctor. Do not stop taking it even if you start to feel better. General instructions  Make sure you: ? Pee until your bladder is empty. ? Do not hold pee for a long time. ? Empty your bladder after sex. ? Wipe from front to back after pooping if you are a female. Use each tissue one time when you wipe.  Drink enough fluid to keep your pee pale yellow.  Keep all follow-up visits as told by your doctor. This is important. Contact a doctor if:  You do not get better after 1-2 days.  Your symptoms go away and then come back. Get help right away if:  You have very bad back pain.  You have very bad pain in your lower belly.  You have a fever.  You are sick to your stomach (nauseous).  You are throwing up. Summary  A urinary tract infection (UTI) is an infection of any part of the urinary tract.  This condition is caused by germs in your genital area.  There are many risk factors for a UTI. These include having a small, thin   tube to drain pee and not being able to control when you pee or poop.  Treatment includes antibiotic medicines for germs.  Drink enough fluid to keep your pee pale yellow. This information is not intended to replace advice given to you by your health care provider. Make sure you discuss any questions you have with your health care provider. Document Revised: 01/27/2018 Document Reviewed: 08/19/2017 Elsevier Patient Education  2020 Elsevier Inc.  
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
## 2020-02-29 LAB — CERVICOVAGINAL ANCILLARY ONLY
Bacterial Vaginitis (gardnerella): NEGATIVE
Candida Glabrata: NEGATIVE
Candida Vaginitis: NEGATIVE
Chlamydia: NEGATIVE
Comment: NEGATIVE
Comment: NEGATIVE
Comment: NEGATIVE
Comment: NEGATIVE
Comment: NEGATIVE
Comment: NORMAL
Neisseria Gonorrhea: NEGATIVE
Trichomonas: NEGATIVE

## 2020-02-29 LAB — URINE CULTURE: Organism ID, Bacteria: NO GROWTH

## 2020-03-06 ENCOUNTER — Other Ambulatory Visit: Payer: Self-pay

## 2020-03-06 ENCOUNTER — Ambulatory Visit (INDEPENDENT_AMBULATORY_CARE_PROVIDER_SITE_OTHER): Payer: No Typology Code available for payment source

## 2020-03-06 ENCOUNTER — Other Ambulatory Visit: Payer: Self-pay | Admitting: Certified Nurse Midwife

## 2020-03-06 DIAGNOSIS — Z30431 Encounter for routine checking of intrauterine contraceptive device: Secondary | ICD-10-CM

## 2020-03-19 ENCOUNTER — Ambulatory Visit
Admission: RE | Admit: 2020-03-19 | Discharge: 2020-03-19 | Disposition: A | Discharge status: Home / Self Care | Payer: No Typology Code available for payment source | Source: Ambulatory Visit | Attending: Certified Nurse Midwife | Admitting: Certified Nurse Midwife

## 2020-03-19 ENCOUNTER — Other Ambulatory Visit: Payer: Self-pay

## 2020-03-19 DIAGNOSIS — Z1231 Encounter for screening mammogram for malignant neoplasm of breast: Secondary | ICD-10-CM | POA: Diagnosis present

## 2020-04-22 ENCOUNTER — Other Ambulatory Visit (HOSPITAL_COMMUNITY)
Admission: RE | Admit: 2020-04-22 | Discharge: 2020-04-22 | Disposition: A | Discharge status: Home / Self Care | Payer: No Typology Code available for payment source | Source: Ambulatory Visit | Attending: Certified Nurse Midwife | Admitting: Certified Nurse Midwife

## 2020-04-22 ENCOUNTER — Ambulatory Visit (INDEPENDENT_AMBULATORY_CARE_PROVIDER_SITE_OTHER): Payer: No Typology Code available for payment source | Admitting: Certified Nurse Midwife

## 2020-04-22 ENCOUNTER — Other Ambulatory Visit: Payer: Self-pay

## 2020-04-22 DIAGNOSIS — Z01419 Encounter for gynecological examination (general) (routine) without abnormal findings: Secondary | ICD-10-CM | POA: Diagnosis present

## 2020-04-22 DIAGNOSIS — Z124 Encounter for screening for malignant neoplasm of cervix: Secondary | ICD-10-CM | POA: Diagnosis present

## 2020-04-22 DIAGNOSIS — Z1231 Encounter for screening mammogram for malignant neoplasm of breast: Secondary | ICD-10-CM | POA: Diagnosis not present

## 2020-04-22 NOTE — Patient Instructions (Signed)
Preventive Care 84-45 Years Old, Female Preventive care refers to lifestyle choices and visits with your health care provider that can promote health and wellness. This includes:  A yearly physical exam. This is also called an annual wellness visit.  Regular dental and eye exams.  Immunizations.  Screening for certain conditions.  Healthy lifestyle choices, such as: ? Eating a healthy diet. ? Getting regular exercise. ? Not using drugs or products that contain nicotine and tobacco. ? Limiting alcohol use. What can I expect for my preventive care visit? Physical exam Your health care provider will check your:  Height and weight. These may be used to calculate your BMI (body mass index). BMI is a measurement that tells if you are at a healthy weight.  Heart rate and blood pressure.  Body temperature.  Skin for abnormal spots. Counseling Your health care provider may ask you questions about your:  Past medical problems.  Family's medical history.  Alcohol, tobacco, and drug use.  Emotional well-being.  Home life and relationship well-being.  Sexual activity.  Diet, exercise, and sleep habits.  Work and work Statistician.  Access to firearms.  Method of birth control.  Menstrual cycle.  Pregnancy history. What immunizations do I need? Vaccines are usually given at various ages, according to a schedule. Your health care provider will recommend vaccines for you based on your age, medical history, and lifestyle or other factors, such as travel or where you work.   What tests do I need? Blood tests  Lipid and cholesterol levels. These may be checked every 5 years, or more often if you are over 3 years old.  Hepatitis C test.  Hepatitis B test. Screening  Lung cancer screening. You may have this screening every year starting at age 45 if you have a 30-pack-year history of smoking and currently smoke or have quit within the past 15 years.  Colorectal cancer  screening. ? All adults should have this screening starting at age 52 and continuing until age 45. ? Your health care provider may recommend screening at age 45 if you are at increased risk. ? You will have tests every 1-10 years, depending on your results and the type of screening test.  Diabetes screening. ? This is done by checking your blood sugar (glucose) after you have not eaten for a while (fasting). ? You may have this done every 1-3 years.  Mammogram. ? This may be done every 1-2 years. ? Talk with your health care provider about when you should start having regular mammograms. This may depend on whether you have a family history of breast cancer.  BRCA-related cancer screening. This may be done if you have a family history of breast, ovarian, tubal, or peritoneal cancers.  Pelvic exam and Pap test. ? This may be done every 3 years starting at age 45. ? Starting at age 45, this may be done every 5 years if you have a Pap test in combination with an HPV test. Other tests  STD (sexually transmitted disease) testing, if you are at risk.  Bone density scan. This is done to screen for osteoporosis. You may have this scan if you are at high risk for osteoporosis. Talk with your health care provider about your test results, treatment options, and if necessary, the need for more tests. Follow these instructions at home: Eating and drinking  Eat a diet that includes fresh fruits and vegetables, whole grains, lean protein, and low-fat dairy products.  Take vitamin and mineral supplements  as recommended by your health care provider.  Do not drink alcohol if: ? Your health care provider tells you not to drink. ? You are pregnant, may be pregnant, or are planning to become pregnant.  If you drink alcohol: ? Limit how much you have to 0-1 drink a day. ? Be aware of how much alcohol is in your drink. In the [place] one drink equals one 12 oz bottle of beer (355 mL), one 5 oz glass of  wine (148 mL), or one 1 oz glass of hard liquor (44 mL).   Lifestyle  Take daily care of your teeth and gums. Brush your teeth every morning and night with fluoride toothpaste. Floss one time each day.  Stay active. Exercise for at least 30 minutes 5 or more days each week.  Do not use any products that contain nicotine or tobacco, such as cigarettes, e-cigarettes, and chewing tobacco. If you need help quitting, ask your health care provider.  Do not use drugs.  If you are sexually active, practice safe sex. Use a condom or other form of protection to prevent STIs (sexually transmitted infections).  If you do not wish to become pregnant, use a form of birth control. If you plan to become pregnant, see your health care provider for a prepregnancy visit.  If told by your health care provider, take low-dose aspirin daily starting at age 45.  Find healthy ways to cope with stress, such as: ? Meditation, yoga, or listening to music. ? Journaling. ? Talking to a trusted person. ? Spending time with friends and family. Safety  Always wear your seat belt while driving or riding in a vehicle.  Do not drive: ? If you have been drinking alcohol. Do not ride with someone who has been drinking. ? When you are tired or distracted. ? While texting.  Wear a helmet and other protective equipment during sports activities.  If you have firearms in your house, make sure you follow all gun safety procedures. What's next?  Visit your health care provider once a year for an annual wellness visit.  Ask your health care provider how often you should have your eyes and teeth checked.  Stay up to date on all vaccines. This information is not intended to replace advice given to you by your health care provider. Make sure you discuss any questions you have with your health care provider. Document Revised: 11/14/2019 Document Reviewed: 10/21/2017 Elsevier Patient Education  2021 [employer].
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
## 2020-04-26 LAB — CYTOLOGY - PAP
Comment: NEGATIVE
Diagnosis: NEGATIVE
High risk HPV: NEGATIVE

## 2020-06-19 ENCOUNTER — Other Ambulatory Visit: Payer: Self-pay

## 2020-06-19 ENCOUNTER — Other Ambulatory Visit (HOSPITAL_COMMUNITY)
Admission: RE | Admit: 2020-06-19 | Discharge: 2020-06-19 | Disposition: A | Discharge status: Home / Self Care | Payer: No Typology Code available for payment source | Source: Ambulatory Visit | Attending: Certified Nurse Midwife | Admitting: Certified Nurse Midwife

## 2020-06-19 ENCOUNTER — Ambulatory Visit: Payer: No Typology Code available for payment source | Admitting: Certified Nurse Midwife

## 2020-06-19 ENCOUNTER — Encounter: Payer: Self-pay | Admitting: Certified Nurse Midwife

## 2020-06-19 VITALS — BP 114/64 | HR 81 | Ht 62.0 in | Wt 120.4 lb

## 2020-06-19 DIAGNOSIS — Z8742 Personal history of other diseases of the female genital tract: Secondary | ICD-10-CM

## 2020-06-19 DIAGNOSIS — R102 Pelvic and perineal pain: Secondary | ICD-10-CM | POA: Insufficient documentation

## 2020-06-19 DIAGNOSIS — Z113 Encounter for screening for infections with a predominantly sexual mode of transmission: Secondary | ICD-10-CM | POA: Diagnosis not present

## 2020-06-19 LAB — POCT URINALYSIS DIPSTICK
Bilirubin, UA: NEGATIVE
Blood, UA: NEGATIVE
Glucose, UA: POSITIVE — AB
Ketones, UA: NEGATIVE
Nitrite, UA: NEGATIVE
Odor: NEGATIVE
Protein, UA: NEGATIVE
Spec Grav, UA: 1.02 (ref 1.010–1.025)
Urobilinogen, UA: 0.2 E.U./dL
pH, UA: 6 (ref 5.0–8.0)

## 2020-06-19 NOTE — Patient Instructions (Signed)
Preventive Care 84-45 Years Old, Female Preventive care refers to lifestyle choices and visits with your health care provider that can promote health and wellness. This includes:  A yearly physical exam. This is also called an annual wellness visit.  Regular dental and eye exams.  Immunizations.  Screening for certain conditions.  Healthy lifestyle choices, such as: ? Eating a healthy diet. ? Getting regular exercise. ? Not using drugs or products that contain nicotine and tobacco. ? Limiting alcohol use. What can I expect for my preventive care visit? Physical exam Your health care provider will check your:  Height and weight. These may be used to calculate your BMI (body mass index). BMI is a measurement that tells if you are at a healthy weight.  Heart rate and blood pressure.  Body temperature.  Skin for abnormal spots. Counseling Your health care provider may ask you questions about your:  Past medical problems.  Family's medical history.  Alcohol, tobacco, and drug use.  Emotional well-being.  Home life and relationship well-being.  Sexual activity.  Diet, exercise, and sleep habits.  Work and work Statistician.  Access to firearms.  Method of birth control.  Menstrual cycle.  Pregnancy history. What immunizations do I need? Vaccines are usually given at various ages, according to a schedule. Your health care provider will recommend vaccines for you based on your age, medical history, and lifestyle or other factors, such as travel or where you work.   What tests do I need? Blood tests  Lipid and cholesterol levels. These may be checked every 5 years, or more often if you are over 3 years old.  Hepatitis C test.  Hepatitis B test. Screening  Lung cancer screening. You may have this screening every year starting at age 73 if you have a 30-pack-year history of smoking and currently smoke or have quit within the past 15 years.  Colorectal cancer  screening. ? All adults should have this screening starting at age 52 and continuing until age 17. ? Your health care provider may recommend screening at age 49 if you are at increased risk. ? You will have tests every 1-10 years, depending on your results and the type of screening test.  Diabetes screening. ? This is done by checking your blood sugar (glucose) after you have not eaten for a while (fasting). ? You may have this done every 1-3 years.  Mammogram. ? This may be done every 1-2 years. ? Talk with your health care provider about when you should start having regular mammograms. This may depend on whether you have a family history of breast cancer.  BRCA-related cancer screening. This may be done if you have a family history of breast, ovarian, tubal, or peritoneal cancers.  Pelvic exam and Pap test. ? This may be done every 3 years starting at age 10. ? Starting at age 11, this may be done every 5 years if you have a Pap test in combination with an HPV test. Other tests  STD (sexually transmitted disease) testing, if you are at risk.  Bone density scan. This is done to screen for osteoporosis. You may have this scan if you are at high risk for osteoporosis. Talk with your health care provider about your test results, treatment options, and if necessary, the need for more tests. Follow these instructions at home: Eating and drinking  Eat a diet that includes fresh fruits and vegetables, whole grains, lean protein, and low-fat dairy products.  Take vitamin and mineral supplements  as recommended by your health care provider.  Do not drink alcohol if: ? Your health care provider tells you not to drink. ? You are pregnant, may be pregnant, or are planning to become pregnant.  If you drink alcohol: ? Limit how much you have to 0-1 drink a day. ? Be aware of how much alcohol is in your drink. In the [place] one drink equals one 12 oz bottle of beer (355 mL), one 5 oz glass of  wine (148 mL), or one 1 oz glass of hard liquor (44 mL).   Lifestyle  Take daily care of your teeth and gums. Brush your teeth every morning and night with fluoride toothpaste. Floss one time each day.  Stay active. Exercise for at least 30 minutes 5 or more days each week.  Do not use any products that contain nicotine or tobacco, such as cigarettes, e-cigarettes, and chewing tobacco. If you need help quitting, ask your health care provider.  Do not use drugs.  If you are sexually active, practice safe sex. Use a condom or other form of protection to prevent STIs (sexually transmitted infections).  If you do not wish to become pregnant, use a form of birth control. If you plan to become pregnant, see your health care provider for a prepregnancy visit.  If told by your health care provider, take low-dose aspirin daily starting at age 50.  Find healthy ways to cope with stress, such as: ? Meditation, yoga, or listening to music. ? Journaling. ? Talking to a trusted person. ? Spending time with friends and family. Safety  Always wear your seat belt while driving or riding in a vehicle.  Do not drive: ? If you have been drinking alcohol. Do not ride with someone who has been drinking. ? When you are tired or distracted. ? While texting.  Wear a helmet and other protective equipment during sports activities.  If you have firearms in your house, make sure you follow all gun safety procedures. What's next?  Visit your health care provider once a year for an annual wellness visit.  Ask your health care provider how often you should have your eyes and teeth checked.  Stay up to date on all vaccines. This information is not intended to replace advice given to you by your health care provider. Make sure you discuss any questions you have with your health care provider. Document Revised: 11/14/2019 Document Reviewed: 10/21/2017 Elsevier Patient Education  2021 [employer]  
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
## 2020-06-20 LAB — CERVICOVAGINAL ANCILLARY ONLY
Bacterial Vaginitis (gardnerella): NEGATIVE
Candida Glabrata: NEGATIVE
Candida Vaginitis: POSITIVE — AB
Chlamydia: NEGATIVE
Comment: NEGATIVE
Comment: NEGATIVE
Comment: NEGATIVE
Comment: NEGATIVE
Comment: NEGATIVE
Comment: NORMAL
Neisseria Gonorrhea: NEGATIVE
Trichomonas: NEGATIVE

## 2020-06-20 LAB — HEPATITIS B SURFACE ANTIGEN: Hepatitis B Surface Ag: NEGATIVE

## 2020-06-20 LAB — HSV(HERPES SIMPLEX VRS) I + II AB-IGG
HSV 1 Glycoprotein G Ab, IgG: <0.91 index (ref 0.00–0.90)
HSV 2 IgG, Type Spec: 4.31 index — ABNORMAL HIGH (ref 0.00–0.90)

## 2020-06-20 LAB — RPR: RPR Ser Ql: NONREACTIVE

## 2020-06-20 LAB — HEPATITIS C ANTIBODY: Hep C Virus Ab: <0.1 s/co ratio (ref 0.0–0.9)

## 2020-06-20 LAB — HIV ANTIBODY (ROUTINE TESTING W REFLEX): HIV Screen 4th Generation wRfx: NONREACTIVE

## 2020-06-20 LAB — HSV-2 IGG SUPPLEMENTAL TEST: HSV-2 IgG Supplemental Test: POSITIVE — AB

## 2020-06-21 ENCOUNTER — Other Ambulatory Visit: Payer: Self-pay | Admitting: Certified Nurse Midwife

## 2020-06-21 DIAGNOSIS — B009 Herpesviral infection, unspecified: Secondary | ICD-10-CM | POA: Insufficient documentation

## 2020-06-21 MED ORDER — FLUCONAZOLE 150 MG PO TABS: 150.0000 mg | ORAL_TABLET | Freq: Once | ORAL | 0 refills | Status: DC

## 2020-06-21 MED ORDER — FLUCONAZOLE 150 MG PO TABS: 150.0000 mg | ORAL_TABLET | Freq: Once | ORAL | 0 refills | Status: AC

## 2020-08-13 ENCOUNTER — Encounter: Payer: No Typology Code available for payment source | Admitting: Certified Nurse Midwife

## 2021-03-17 ENCOUNTER — Other Ambulatory Visit (HOSPITAL_COMMUNITY)
Admission: RE | Admit: 2021-03-17 | Discharge: 2021-03-17 | Disposition: A | Discharge status: Home / Self Care | Payer: No Typology Code available for payment source | Source: Ambulatory Visit | Attending: Certified Nurse Midwife | Admitting: Certified Nurse Midwife

## 2021-03-17 ENCOUNTER — Encounter: Payer: Self-pay | Admitting: Certified Nurse Midwife

## 2021-03-17 ENCOUNTER — Other Ambulatory Visit: Payer: Self-pay

## 2021-03-17 ENCOUNTER — Ambulatory Visit: Payer: No Typology Code available for payment source | Admitting: Certified Nurse Midwife

## 2021-03-17 VITALS — BP 123/80 | HR 93 | Ht 62.0 in | Wt 124.1 lb

## 2021-03-17 DIAGNOSIS — T8332XA Displacement of intrauterine contraceptive device, initial encounter: Secondary | ICD-10-CM | POA: Diagnosis not present

## 2021-03-17 DIAGNOSIS — Z8742 Personal history of other diseases of the female genital tract: Secondary | ICD-10-CM

## 2021-03-17 DIAGNOSIS — N898 Other specified noninflammatory disorders of vagina: Secondary | ICD-10-CM | POA: Insufficient documentation

## 2021-03-17 DIAGNOSIS — R102 Pelvic and perineal pain: Secondary | ICD-10-CM
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
## 2021-03-18 ENCOUNTER — Ambulatory Visit (INDEPENDENT_AMBULATORY_CARE_PROVIDER_SITE_OTHER): Payer: No Typology Code available for payment source

## 2021-03-18 DIAGNOSIS — Z8742 Personal history of other diseases of the female genital tract: Secondary | ICD-10-CM

## 2021-03-18 DIAGNOSIS — T8332XA Displacement of intrauterine contraceptive device, initial encounter: Secondary | ICD-10-CM | POA: Diagnosis not present

## 2021-03-18 DIAGNOSIS — R102 Pelvic and perineal pain: Secondary | ICD-10-CM | POA: Diagnosis not present

## 2021-03-19 ENCOUNTER — Telehealth: Payer: Self-pay | Admitting: Certified Nurse Midwife

## 2021-03-19 LAB — CERVICOVAGINAL ANCILLARY ONLY
Bacterial Vaginitis (gardnerella): NEGATIVE
Candida Glabrata: NEGATIVE
Candida Vaginitis: NEGATIVE
Comment: NEGATIVE
Comment: NEGATIVE
Comment: NEGATIVE

## 2021-03-19 NOTE — Telephone Encounter (Signed)
Pt called and was wanting the results from her ultrasound and cervix swab on 03/17/2021. I informed patient to allow 48 to 72 hours for those results to be explained to her.

## 2021-03-19 NOTE — Telephone Encounter (Signed)
LM w pt informing of swab results

## 2021-03-28 ENCOUNTER — Telehealth: Payer: Self-pay

## 2021-03-30 ENCOUNTER — Encounter: Payer: Self-pay | Admitting: Certified Nurse Midwife

## 2021-04-21 ENCOUNTER — Other Ambulatory Visit: Payer: Self-pay

## 2021-04-21 ENCOUNTER — Ambulatory Visit
Admission: RE | Admit: 2021-04-21 | Discharge: 2021-04-21 | Disposition: A | Discharge status: Home / Self Care | Payer: No Typology Code available for payment source | Source: Ambulatory Visit | Attending: Certified Nurse Midwife | Admitting: Certified Nurse Midwife

## 2021-04-21 DIAGNOSIS — Z1231 Encounter for screening mammogram for malignant neoplasm of breast: Secondary | ICD-10-CM | POA: Insufficient documentation

## 2021-04-21 DIAGNOSIS — Z01419 Encounter for gynecological examination (general) (routine) without abnormal findings: Secondary | ICD-10-CM | POA: Insufficient documentation

## 2021-04-23 ENCOUNTER — Encounter: Payer: Self-pay | Admitting: Obstetrics

## 2021-04-28 ENCOUNTER — Encounter: Payer: No Typology Code available for payment source | Admitting: Certified Nurse Midwife

## 2021-05-27 ENCOUNTER — Encounter: Payer: No Typology Code available for payment source | Admitting: Certified Nurse Midwife

## 2021-07-16 ENCOUNTER — Other Ambulatory Visit: Payer: Self-pay

## 2021-07-16 ENCOUNTER — Telehealth: Payer: No Typology Code available for payment source | Admitting: Nurse Practitioner

## 2021-07-16 ENCOUNTER — Emergency Department
Admission: EM | Admit: 2021-07-16 | Discharge: 2021-07-16 | Disposition: A | Discharge status: Home / Self Care | Payer: No Typology Code available for payment source | Attending: Emergency Medicine | Admitting: Emergency Medicine

## 2021-07-16 ENCOUNTER — Emergency Department: Payer: No Typology Code available for payment source

## 2021-07-16 DIAGNOSIS — M940 Chondrocostal junction syndrome [Tietze]: Secondary | ICD-10-CM | POA: Insufficient documentation

## 2021-07-16 DIAGNOSIS — E119 Type 2 diabetes mellitus without complications: Secondary | ICD-10-CM | POA: Diagnosis not present

## 2021-07-16 DIAGNOSIS — R079 Chest pain, unspecified: Secondary | ICD-10-CM | POA: Diagnosis present

## 2021-07-16 DIAGNOSIS — J069 Acute upper respiratory infection, unspecified: Secondary | ICD-10-CM | POA: Diagnosis not present

## 2021-07-16 DIAGNOSIS — R059 Cough, unspecified: Secondary | ICD-10-CM | POA: Insufficient documentation

## 2021-07-16 LAB — CBC
HCT: 41.9 % (ref 36.0–46.0)
Hemoglobin: 13.8 g/dL (ref 12.0–15.0)
MCH: 31.8 pg (ref 26.0–34.0)
MCHC: 32.9 g/dL (ref 30.0–36.0)
MCV: 96.5 fL (ref 80.0–100.0)
Platelets: 277 10*3/uL (ref 150–400)
RBC: 4.34 MIL/uL (ref 3.87–5.11)
RDW: 12.9 % (ref 11.5–15.5)
WBC: 8 10*3/uL (ref 4.0–10.5)
nRBC: 0 % (ref 0.0–0.2)

## 2021-07-16 LAB — BASIC METABOLIC PANEL
Anion gap: 9 (ref 5–15)
BUN: 13 mg/dL (ref 6–20)
CO2: 23 mmol/L (ref 22–32)
Calcium: 9.1 mg/dL (ref 8.9–10.3)
Chloride: 104 mmol/L (ref 98–111)
Creatinine, Ser: 0.72 mg/dL (ref 0.44–1.00)
GFR, Estimated: >60 mL/min (ref >60)
Glucose, Bld: 269 mg/dL — ABNORMAL HIGH (ref 70–99)
Potassium: 4 mmol/L (ref 3.5–5.1)
Sodium: 136 mmol/L (ref 135–145)

## 2021-07-16 LAB — TROPONIN I (HIGH SENSITIVITY)
Troponin I (High Sensitivity): 3 ng/L (ref <18)
Troponin I (High Sensitivity): 3 ng/L (ref <18)

## 2021-07-16 MED ORDER — MELOXICAM 15 MG PO TABS: 15.0000 mg | ORAL_TABLET | Freq: Every day | ORAL | 0 refills | Status: AC

## 2021-07-16 MED ORDER — MELOXICAM 7.5 MG PO TABS
15.0000 mg | ORAL_TABLET | Freq: Once | ORAL | Status: AC
  Administered 2021-07-16: 15 mg via ORAL
  Filled 2021-07-16: qty 2

## 2021-07-16 MED ORDER — IOHEXOL 350 MG/ML SOLN
75.0000 mL | Freq: Once | INTRAVENOUS | Status: AC | PRN
  Administered 2021-07-16: 75 mL via INTRAVENOUS

## 2021-07-16 NOTE — ED Triage Notes (Signed)
Pt comes with c/o CP that started few days ago. Pt states cough at first and then woke up this am with pressure and felt like someone was sitting on chest. Pt states dry throat.  Pt is diabetic.   Pt states some pain when taking breath but pressure as well.

## 2021-07-16 NOTE — Progress Notes (Signed)
Virtual Visit Consent   [name], you are scheduled for a virtual visit with Mary-Margaret Hassell Done, FNP, a [facility] provider, today.     Just as with appointments in the office, your consent must be obtained to participate.  Your consent will be active for this visit and any virtual visit you may have with one of our providers in the next 365 days.     If you have a MyChart account, a copy of this consent can be sent to you electronically.  All virtual visits are billed to your insurance company just like a traditional visit in the office.    As this is a virtual visit, video technology does not allow for your provider to perform a traditional examination.  This may limit your provider's ability to fully assess your condition.  If your provider identifies any concerns that need to be evaluated in person or the need to arrange testing (such as labs, EKG, etc.), we will make arrangements to do so.     Although advances in technology are sophisticated, we cannot ensure that it will always work on either your end or our end.  If the connection with a video visit is poor, the visit may have to be switched to a telephone visit.  With either a video or telephone visit, we are not always able to ensure that we have a secure connection.     I need to obtain your verbal consent now.   Are you willing to proceed with your visit today? YES   Niasia KIARI HOSMER has provided verbal consent on 07/16/2021 for a virtual visit (video or telephone).   Mary-Margaret Hassell Done, FNP   Date: 07/16/2021 11:36 AM   Virtual Visit via Video Note   I, Mary-Margaret Hassell Done, connected with [name] ([id number], 1978/01/09) on 07/16/21 at 11:45 AM EDT by a video-enabled telemedicine application and verified that I am speaking with the correct person using two identifiers.  Location: Patient: Virtual Visit Location Patient: Other: work Provider: Ecologist: Mobile   I discussed the  limitations of evaluation and management by telemedicine and the availability of in person appointments. The patient expressed understanding and agreed to proceed.    History of Present Illness: [name] is a 46 y.o. who identifies as a female who was assigned female at birth, and is being seen today for URI and chest pain.  HPI: Patient states she is having respiratory symptoms and feels like an elephant is sitting on her chest. Started yesterday with a cough and head congestion. She has not taking any at home meds. She is a diabetic an d was just concerned because she feels so bad.   Review of Systems  Constitutional:  Positive for malaise/fatigue. Negative for chills and fever.  HENT:  Positive for congestion. Negative for sinus pain and sore throat.   Respiratory:  Positive for cough.   Cardiovascular:  Positive for chest pain. Negative for palpitations.  Neurological:  Negative for dizziness and headaches.   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  90 day supply., Disp: , Rfl:    Continuous Blood  Gluc Transmit (DEXCOM G6 TRANSMITTER) MISC, by Does not apply route., Disp: , Rfl:    glucagon (GLUCAGON EMERGENCY) 1 MG injection, Inject 1 mg into the muscle once as needed., Disp: 2 each, Rfl: prn   glucose blood (ONE TOUCH ULTRA TEST) test strip, Use for sugar checks 4 times daily, Disp: 120 each, Rfl: 12   ibuprofen (ADVIL,MOTRIN) 200 MG tablet, Take 400 mg by mouth every 6 (six) hours as needed. For pain, Disp: , Rfl:    injection device for insulin (HUMAPEN LUXURA HD) DEVI, Use between 4-14 units with each meal based on sliding scale. TDD 45 units, Disp: 2 each, Rfl: 1   insulin lispro (HUMALOG) 100 UNIT/ML KiwkPen, Inject on a sliding scale between 4-14 units with each meal. Max daily dose 45 units, Disp: 15 mL, Rfl: 3   Insulin Pen Needle (PEN NEEDLES) 31G X 6 MM MISC, Use as directed with insulin pen four times daily, Disp: 120 each, Rfl: 3   LANTUS SOLOSTAR 100 UNIT/ML Solostar Pen, Inject into the skin., Disp: , Rfl:    levonorgestrel (MIRENA) 20 MCG/24HR IUD, 1 Intra Uterine Device (1 each total) by Intrauterine route once., Disp: 1 each, Rfl: 0   ONETOUCH DELICA LANCETS 27P MISC, Use for sugar checks 4  Times daily, Disp: 120 each, Rfl: 11   vitamin C (ASCORBIC ACID) 500 MG tablet, Take 500 mg by mouth daily., Disp: , Rfl:   Observations/Objective: Patient is well-developed, well-nourished in no acute distress.  Resting comfortably  at home.  Head is normocephalic, atraumatic.  No labored breathing.  Speech is clear and coherent with logical content.  Patient is alert and oriented at baseline.  Voice raspy No cough noted during appointment  Assessment and Plan:  [name] in today with chief complaint of URI   1. Chest pain, unspecified type Needs to be seen I urgent care or ED to make sure her heart is ok. Need to not wait.  2. URI with cough and congestion ED will address this when she goes in to have eart valuated.  Follow Up Instructions: I discussed the  assessment and treatment plan with the patient. The patient was provided an opportunity to ask questions and all were answered. The patient agreed with the plan and demonstrated an understanding of the instructions.  A copy of instructions were sent to the patient via MyChart.  The patient was advised to call back or seek an in-person evaluation if the symptoms worsen or if the condition fails to improve as anticipated.  Time:  I spent 11 minutes with the patient via telehealth technology discussing the above problems/concerns.    Mary-Margaret Hassell Done, FNP

## 2021-07-16 NOTE — ED Provider Triage Note (Signed)
Emergency Medicine Provider Triage Evaluation Note  [name] , a 46 y.o. female  was evaluated in triage.  Patient has a history of GERD, and type 1 diabetes with 1 prior instance of DKA presenting to the emergency department with chest pressure.  Patient states that she has had 1 day of cough with no associated rhinorrhea, nasal congestion, vomiting, diarrhea or fever.  She has had some recent travel and is a daily smoker.  Denies prior history of PE.  States that her glucose levels have been running in the 400s which is atypical for her.  Review of Systems  Positive: Patient has chest pressure.  Negative: No nausea, vomiting or abdominal pain.   Physical Exam  BP (!) 146/89   Pulse 84   Temp 98.8 F (37.1 C) (Oral)   Resp 18   SpO2 99%  Gen:   Awake, no distress   Resp:  Normal effort  MSK:   Moves extremities without difficulty   Medical Decision Making  Medically screening exam initiated at 5:25 PM.  Appropriate orders placed.  Blanka DAMONIE FURNEY was informed that the remainder of the evaluation will be completed by another provider, this initial triage assessment does not replace that evaluation, and the importance of remaining in the ED until their evaluation is complete.     Vallarie Mare Daphne, [place] 07/16/21 1727

## 2021-07-16 NOTE — Telephone Encounter (Signed)
Error encounter. 

## 2021-07-16 NOTE — Patient Instructions (Signed)
Nonspecific Chest Pain Chest pain can be caused by many different conditions. Some causes of chest pain can be life-threatening. These will require treatment right away. Serious causes of chest pain include: Heart attack. A tear in the body's main blood vessel. Redness and swelling (inflammation) around your heart. Blood clot in your lungs. Other causes of chest pain may not be so serious. These include: Heartburn. Anxiety or stress. Damage to bones or muscles in your chest. Lung infections. Chest pain can feel like: Pain or discomfort in your chest. Crushing, pressure, aching, or squeezing pain. Burning or tingling. Dull or sharp pain that is worse when you move, cough, or take a deep breath. Pain or discomfort that is also felt in your back, neck, jaw, shoulder, or arm, or pain that spreads to any of these areas. It is hard to know whether your pain is caused by something that is serious or something that is not so serious. So it is important to see your doctor right away if you have chest pain. Follow these instructions at home: Medicines Take over-the-counter and prescription medicines only as told by your doctor. If you were prescribed an antibiotic medicine, take it as told by your doctor. Do not stop taking the antibiotic even if you start to feel better. Lifestyle  Rest as told by your doctor. Do not use any products that contain nicotine or tobacco, such as cigarettes, e-cigarettes, and chewing tobacco. If you need help quitting, ask your doctor. Do not drink alcohol. Make lifestyle changes as told by your doctor. These may include: Getting regular exercise. Ask your doctor what activities are safe for you. Eating a heart-healthy diet. A diet and nutrition specialist (dietitian) can help you to learn healthy eating options. Staying at a healthy weight. Treating diabetes or high blood pressure, if needed. Lowering your stress. Activities such as yoga and relaxation techniques  can help. General instructions Pay attention to any changes in your symptoms. Tell your doctor about them or any new symptoms. Avoid any activities that cause chest pain. Keep all follow-up visits as told by your doctor. This is important. You may need more testing if your chest pain does not go away. Contact a doctor if: Your chest pain does not go away. You feel depressed. You have a fever. Get help right away if: Your chest pain is worse. You have a cough that gets worse, or you cough up blood. You have very bad (severe) pain in your belly (abdomen). You pass out (faint). You have either of these for no clear reason: Sudden chest discomfort. Sudden discomfort in your arms, back, neck, or jaw. You have shortness of breath at any time. You suddenly start to sweat, or your skin gets clammy. You feel sick to your stomach (nauseous). You throw up (vomit). You suddenly feel lightheaded or dizzy. You feel very weak or tired. Your heart starts to beat fast, or it feels like it is skipping beats. These symptoms may be an emergency. Do not wait to see if the symptoms will go away. Get medical help right away. Call your local emergency services (911 in the [place] Do not drive yourself to the hospital. Summary Chest pain can be caused by many different conditions. The cause may be serious and need treatment right away. If you have chest pain, see your doctor right away. Follow your doctor's instructions for taking medicines and making lifestyle changes. Keep all follow-up visits as told by your doctor. This includes visits for any further   testing if your chest pain does not go away. Be sure to know the signs that show that your condition has become worse. Get help right away if you have these symptoms. This information is not intended to replace advice given to you by your health care provider. Make sure you discuss any questions you have with your health care provider. Document Revised:  04/25/2020 Document Reviewed: 04/25/2020 Elsevier Patient Education  2023 Elsevier Inc.  

## 2021-07-16 NOTE — ED Provider Notes (Signed)
[facility] Provider Note  Patient Contact: 7:12 PM (approximate)   History   Chest Pain   HPI  [name] is a 46 y.o. female who presents the emergency department complaining of central chest pain.  Patient states that over the last 2 days she felt like she was coming down with a cold, did have some ongoing cough but no nasal congestion, fevers, GI symptoms.  Patient also recently had a long trip to [place] and back 2 days prior to the onset of her chest pain.  No history of bleeding or clotting disorders.  She is a diabetic.  Patient does not use hormonal birth control.     Physical Exam   Triage Vital Signs: ED Triage Vitals  Enc Vitals Group     BP 07/16/21 1725 (!) 146/89     Pulse Rate 07/16/21 1725 84     Resp 07/16/21 1725 18     Temp 07/16/21 1725 98.8 F (37.1 C)     Temp Source 07/16/21 1725 Oral     SpO2 07/16/21 1725 99 %     Weight --      Height --      Head Circumference --      Peak Flow --      Pain Score 07/16/21 1723 0     Pain Loc --      Pain Edu? --      Excl. in [place]? --     Most recent vital signs: Vitals:   07/16/21 1947 07/16/21 2053  BP:  126/83  Pulse:  79  Resp: 16 18  Temp:  99.2 F (37.3 C)  SpO2: 100% 100%     General: Alert and in no acute distress. ENT:      Ears:       Nose: No congestion/rhinnorhea.      Mouth/Throat: Mucous membranes are moist. Neck: No stridor. No cervical spine tenderness to palpation. Hematological/Lymphatic/Immunilogical: No cervical lymphadenopathy. Cardiovascular:  Good peripheral perfusion.  No appreciable murmurs, rubs, gallops Respiratory: Normal respiratory effort without tachypnea or retractions. Lungs CTAB. Good air entry to the bases with no decreased or absent breath sounds Gastrointestinal: Bowel sounds 4 quadrants. Soft and nontender to palpation. No guarding or rigidity. No palpable masses. No distention. Musculoskeletal: Full range of motion to  all extremities.  Neurologic:  No gross focal neurologic deficits are appreciated.  Skin:   No rash noted Other:   ED Results / Procedures / Treatments   Labs (all labs ordered are listed, but only abnormal results are displayed) Labs Reviewed  BASIC METABOLIC PANEL - Abnormal; Notable for the following components:      Result Value   Glucose, Bld 269 (*)    All other components within normal limits  CBC  TROPONIN I (HIGH SENSITIVITY)  TROPONIN I (HIGH SENSITIVITY)     EKG     RADIOLOGY  I personally viewed, evaluated, and interpreted these images as part of my medical decision making, as well as reviewing the written report by the radiologist.  ED Provider Interpretation: No acute cardiopulmonary findings on chest x-ray.  No acute findings on chest x-ray.  CT angio PE chest reveals no evidence of pulmonary embolism or other acute cardiopulmonary abnormality  DG Chest 2 View  Result Date: 07/16/2021 CLINICAL DATA:  Chest pain starting a few days ago.  Cough. EXAM: CHEST - 2 VIEW COMPARISON:  None Available. FINDINGS: The heart size and mediastinal contours are within normal  limits. Both lungs are clear. The visualized skeletal structures are unremarkable. IMPRESSION: No active cardiopulmonary disease. Electronically Signed   By: Lucienne Capers M.D.   On: 07/16/2021 17:44   CT Angio Chest PE W and/or Wo Contrast  Result Date: 07/16/2021 CLINICAL DATA:  Chest pain EXAM: CT ANGIOGRAPHY CHEST WITH CONTRAST TECHNIQUE: Multidetector CT imaging of the chest was performed using the standard protocol during bolus administration of intravenous contrast. Multiplanar CT image reconstructions and MIPs were obtained to evaluate the vascular anatomy. RADIATION DOSE REDUCTION: This exam was performed according to the departmental dose-optimization program which includes automated exposure control, adjustment of the mA and/or kV according to patient size and/or use of iterative reconstruction  technique. CONTRAST:  32m OMNIPAQUE IOHEXOL 350 MG/ML SOLN COMPARISON:  Chest radiograph done earlier today FINDINGS: Cardiovascular: There is homogeneous enhancement in thoracic aorta. There are no intraluminal filling defects in the pulmonary artery branches. Mediastinum/Nodes: No significant lymphadenopathy seen. Lungs/Pleura: There is no focal pulmonary consolidation. There is no pleural effusion or pneumothorax. Upper Abdomen: Liver appears larger than usual in size. Overall size could not be estimated as much of the liver is not included in the images. Musculoskeletal: Unremarkable. Review of the MIP images confirms the above findings. IMPRESSION: There is no evidence of pulmonary artery embolism. There is no evidence of thoracic aortic dissection. There is no focal pulmonary consolidation. Electronically Signed   By: PElmer PickerM.D.   On: 07/16/2021 20:11    PROCEDURES:  Critical Care performed: No  Procedures   MEDICATIONS ORDERED IN ED: Medications  iohexol (OMNIPAQUE) 350 MG/ML injection 75 mL (75 mLs Intravenous Contrast Given 07/16/21 1954)  meloxicam (MOBIC) tablet 15 mg (15 mg Oral Given 07/16/21 2051)     IMPRESSION / MDM / ASSESSMENT AND PLAN / ED COURSE  I reviewed the triage vital signs and the nursing notes.                              Differential diagnosis includes, but is not limited to, STEMI, NSTEMI, PE, bronchitis, pneumonia, costochondritis   Patient's diagnosis is consistent with nonspecific chest pain with costochondritis.  Patient presented to the emergency department with a chest pain that have been ongoing x1 day.  No cardiac history.  Patient states that the pain was slightly pleuritic in nature.  She states that she thought she was coming down with a viral illness earlier this week, had a bit of coughing before developing chest pain.  Overall physical exam was reassuring.  Chest x-ray, labs, EKG are reassuring.  Given the complaint, I did image the  patient with CT scan to ensure no evidence of PE which was also negative.  With the arrival complaint, admission was considered but with reassuring work-up to include EKG, imaging and labs feel that patient is stable for discharge.  Given the coughing prior to the onset of chest pain I feel that there is likely a component of costochondritis and will prescribe meloxicam.  Follow-up with primary care as needed..  Patient is given ED precautions to return to the ED for any worsening or new symptoms.        FINAL CLINICAL IMPRESSION(S) / ED DIAGNOSES   Final diagnoses:  Nonspecific chest pain  Costochondritis     Rx / DC Orders   ED Discharge Orders          Ordered    meloxicam (MOBIC) 15 MG tablet  Daily  07/16/21 2033             Note:  This document was prepared using Dragon voice recognition software and may include unintentional dictation errors.   Darletta Moll, PA-C 07/16/21 2354    Naaman Plummer, MD 07/20/21 [phone]

## 2021-07-16 NOTE — ED Notes (Addendum)
See triage note. Pt reports woke up at 2 and then 4 this morning with chest pressure/discomfort, dry/scratchy throat, and HA; pt denies dizziness, nausea, fever, SOB, and diaphoresis. Pt's resp reg/unlabored, skin dry, sitting calmly on stretcher. Pt type 1 DM; states most recent BG on monitor resulted in 180s; states has been high over last few days.

## 2021-07-20 ENCOUNTER — Encounter: Payer: Self-pay | Admitting: Certified Nurse Midwife

## 2021-08-19 ENCOUNTER — Encounter: Payer: No Typology Code available for payment source | Admitting: Certified Nurse Midwife

## 2021-11-27 ENCOUNTER — Ambulatory Visit: Payer: No Typology Code available for payment source | Admitting: Family Medicine

## 2021-12-22 ENCOUNTER — Ambulatory Visit: Payer: No Typology Code available for payment source | Admitting: Nurse Practitioner

## 2021-12-22 DIAGNOSIS — Z803 Family history of malignant neoplasm of breast: Secondary | ICD-10-CM | POA: Insufficient documentation

## 2021-12-22 DIAGNOSIS — Z8041 Family history of malignant neoplasm of ovary: Secondary | ICD-10-CM | POA: Insufficient documentation

## 2021-12-22 NOTE — Progress Notes (Deleted)
There were no vitals taken for this visit.   Subjective:    Patient ID: [name], female    DOB: 1975/04/19, 46 y.o.   MRN: [id number]  HPI: [name] is a 46 y.o. female  No chief complaint on file.  Establish care:  Her last physical was ***.  Her last pap was ***.  Colorectal cancer screening***.  Mammogram ***. She has a history of ***.  Type 1 diabetes: Her last A1C was 8.8 on 11/12/2021. She is followed by endocrinology Dr. Kenton Kingfisher at [facility]. Next appointment is 04/2022.  According to her last office visit the plan is 1. Type 1 diabetes mellitus with hyperglycemia (CMS-HCC) Uncontrolled w HgbA1c 9 on mdi of insulin and Dexcom CGM. Glucoses are higher than we would like across the board, but I am hesitant to increase Lantus as you are already on 2x as much as I would expect based on your weight, and have had an increase in overnight lows this past week that you are back on your home schedule/diet.  Do not be afraid to give more Humalog coverage with higher carb meals, but will not tighen carb ratio based on review of CGM.  - Albumin/creatinine urine ratio - insulin degludec (TRESIBA FLEXTOUCH U-100) 100 unit/mL (3 mL) InPn; Inject 0.21 mL (21 Units total) under the skin daily. Titrate to max 25units daily. Dispense: 30 mL; Refill: 4 - TSH - Basic Metabolic Panel - blood-glucose sensor (DEXCOM G6 SENSOR) Devi; Dispense Dexcom G6 SENSORS. 90 day supply. Dispense: 9 each; Refill: 3 - blood-glucose transmitter (DEXCOM G6 TRANSMITTER) Devi; Dispense Dexcom G6 TRANSMITTER. 90 day supply Dispense: 1 each; Refill: 3  Relevant past medical, surgical, family and social history reviewed and updated as indicated. Interim medical history since our last visit reviewed. Allergies and medications reviewed and updated.  Review of Systems  Per HPI unless specifically indicated above     Objective:    There were no vitals taken for this visit.  Wt Readings  from Last 3 Encounters:  03/17/21 124 lb 1.6 oz (56.3 kg)  06/19/20 120 lb 6.4 oz (54.6 kg)  02/27/20 119 lb (54 kg)    Physical Exam  Results for orders placed or performed during the hospital encounter of 71/24/58  Basic metabolic panel  Result Value Ref Range   Sodium 136 135 - 145 mmol/L   Potassium 4.0 3.5 - 5.1 mmol/L   Chloride 104 98 - 111 mmol/L   CO2 23 22 - 32 mmol/L   Glucose, Bld 269 (H) 70 - 99 mg/dL   BUN 13 6 - 20 mg/dL   Creatinine, Ser 0.72 0.44 - 1.00 mg/dL   Calcium 9.1 8.9 - 10.3 mg/dL   GFR, Estimated >60 >60 mL/min   Anion gap 9 5 - 15  CBC  Result Value Ref Range   WBC 8.0 4.0 - 10.5 K/uL   RBC 4.34 3.87 - 5.11 MIL/uL   Hemoglobin 13.8 12.0 - 15.0 g/dL   HCT 41.9 36.0 - 46.0 %   MCV 96.5 80.0 - 100.0 fL   MCH 31.8 26.0 - 34.0 pg   MCHC 32.9 30.0 - 36.0 g/dL   RDW 12.9 11.5 - 15.5 %   Platelets 277 150 - 400 K/uL   nRBC 0.0 0.0 - 0.2 %  Troponin I (High Sensitivity)  Result Value Ref Range   Troponin I (High Sensitivity) 3 <18 ng/L  Troponin I (High Sensitivity)  Result Value Ref Range  Troponin I (High Sensitivity) 3 <18 ng/L      Assessment & Plan:   Problem List Items Addressed This Visit   None    Follow up plan: No follow-ups on file.

## 2022-02-13 ENCOUNTER — Other Ambulatory Visit: Payer: Self-pay

## 2022-02-13 DIAGNOSIS — Z1231 Encounter for screening mammogram for malignant neoplasm of breast: Secondary | ICD-10-CM

## 2022-04-23 ENCOUNTER — Ambulatory Visit
Admission: RE | Admit: 2022-04-23 | Discharge: 2022-04-23 | Disposition: A | Discharge status: Home / Self Care | Payer: 59 | Source: Ambulatory Visit | Attending: Certified Nurse Midwife | Admitting: Certified Nurse Midwife

## 2022-04-23 DIAGNOSIS — Z1231 Encounter for screening mammogram for malignant neoplasm of breast: Secondary | ICD-10-CM | POA: Diagnosis present

## 2022-04-27 ENCOUNTER — Encounter: Payer: Self-pay | Admitting: Certified Nurse Midwife

## 2022-05-01 ENCOUNTER — Other Ambulatory Visit: Payer: Self-pay | Admitting: Certified Nurse Midwife

## 2022-05-01 DIAGNOSIS — N6489 Other specified disorders of breast: Secondary | ICD-10-CM

## 2022-05-01 DIAGNOSIS — N63 Unspecified lump in unspecified breast: Secondary | ICD-10-CM

## 2022-05-01 DIAGNOSIS — R928 Other abnormal and inconclusive findings on diagnostic imaging of breast: Secondary | ICD-10-CM

## 2022-05-08 ENCOUNTER — Ambulatory Visit
Admission: RE | Admit: 2022-05-08 | Discharge: 2022-05-08 | Disposition: A | Discharge status: Home / Self Care | Payer: 59 | Source: Ambulatory Visit | Attending: Certified Nurse Midwife | Admitting: Certified Nurse Midwife

## 2022-05-08 DIAGNOSIS — R928 Other abnormal and inconclusive findings on diagnostic imaging of breast: Secondary | ICD-10-CM | POA: Insufficient documentation

## 2022-05-08 DIAGNOSIS — N63 Unspecified lump in unspecified breast: Secondary | ICD-10-CM | POA: Diagnosis present

## 2022-05-08 DIAGNOSIS — N6489 Other specified disorders of breast: Secondary | ICD-10-CM | POA: Diagnosis present

## 2023-03-07 ENCOUNTER — Ambulatory Visit
Admission: EM | Admit: 2023-03-07 | Discharge: 2023-03-07 | Disposition: A | Discharge status: Home / Self Care | Payer: BC Managed Care – PPO | Attending: Emergency Medicine | Admitting: Emergency Medicine

## 2023-03-07 DIAGNOSIS — F17200 Nicotine dependence, unspecified, uncomplicated: Secondary | ICD-10-CM

## 2023-03-07 DIAGNOSIS — J069 Acute upper respiratory infection, unspecified: Secondary | ICD-10-CM | POA: Diagnosis not present

## 2023-03-07 MED ORDER — AZITHROMYCIN 250 MG PO TABS: 250.0000 mg | ORAL_TABLET | Freq: Every day | ORAL | 0 refills | Status: DC

## 2023-03-07 MED ORDER — BENZONATATE 100 MG PO CAPS: 100.0000 mg | ORAL_CAPSULE | Freq: Three times a day (TID) | ORAL | 0 refills | Status: DC | PRN
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
## 2023-03-07 NOTE — Discharge Instructions (Addendum)
 Take the Zithromax and Tessalon Perles as directed.    Follow up with your primary care provider if your symptoms are not improving.

## 2023-03-07 NOTE — ED Triage Notes (Signed)
 Patient to Urgent Care with complaints of productive cough/ nasal and chest congestion/ fatigue. Denies any known fevers.   Reports symptoms started Thursday.   Meds: Advil.

## 2023-03-09 ENCOUNTER — Other Ambulatory Visit: Payer: Self-pay | Admitting: Certified Nurse Midwife

## 2023-03-09 DIAGNOSIS — Z1231 Encounter for screening mammogram for malignant neoplasm of breast: Secondary | ICD-10-CM

## 2023-03-16 ENCOUNTER — Ambulatory Visit: Payer: Self-pay | Admitting: Surgery

## 2023-03-16 NOTE — H&P (Signed)
Subjective:   CC: Perianal abscess [K61.0]  HPI:  [name] is a 48 y.o. female who was referred by Janyce Llanos, * for evaluation of above. First noted a few weeks ago.  Symptoms include: Pain is sharp, localized waxing and waning pain.  Exacerbated by touch.  Alleviated by nothing specific.  Associated with fluctuating lump in area but no discharge.  Also has skin discoloration     Past Medical History:  has a past medical history of Diabetes mellitus without complication (CMS/HHS-HCC), Fungal infection, Overactive bladder, and Tumor, foot (05/24/2012).  Past Surgical History:  has a past surgical history that includes Cholecystectomy (2010); bladder scan (2014); Knee arthroscopy (Left, 2001); and excision tumor foot (Right, 11/02/2012).  Family History: family history includes Asthma in her father; Breast cancer in an other family member; Cancer in her mother; Diabetes in her father; Heart disease in her father and mother; Melanoma in her mother; Ovarian cancer in her mother; Reflux disease in her father; Sarcoma (Cancer of soft tissue) in her mother; Seizures in her mother; Stroke in her father; Ulcers in her father.  Social History:  reports that she has been smoking cigarettes. She has never used smokeless tobacco. She reports current alcohol use of about 8.0 standard drinks of alcohol per week. She reports that she does not use drugs.  Current Medications: has a current medication list which includes the following prescription(s): ascorbic acid (vitamin c), blood glucose diagnostic, dexcom g6 sensor, dexcom g7 sensor, dexcom g6 transmitter, insulin aspart protamine-aspart, insulin glargine, insulin lispro, omnipod 5 g6 intro kit (gen 5), omnipod 5 g6 pods (gen 5), and levonorgestrel.  Allergies:  Allergies  Allergen Reactions   Nutritional Supplements Other (See Comments)    SKIN BOILS  CAUSED BY ORANGES   Orange Juice Other (See Comments)    boils   Orange Oil Other  (See Comments)    boils    ROS:  A 15 point review of systems was performed and pertinent positives and negatives noted in HPI   Objective:     BP 94/60   Pulse 79   Ht 157.5 cm (5\' 2" )   Wt 59 kg (130 lb)   LMP  (LMP Unknown)   BMI 23.78 kg/m   Constitutional :  No distress, cooperative, alert  Lymphatics/Throat:  Supple with no lymphadenopathy  Respiratory:  Clear to auscultation bilaterally  Cardiovascular:  Regular rate and rhythm  Gastrointestinal: Soft, non-tender, non-distended, no organomegaly.  Musculoskeletal: Steady gait and movement  Skin: Cool and moist  Psychiatric: Normal affect, non-agitated, not confused  Rectal: Chaperone present for exam.  Notable fullness at perianal area concerning for possible abscess, potentially fistula.  Right anterior aspect      LABS:  N/a   RADS: N/a  Assessment:      Perianal abscess [K61.0]- possible fistula contributing to it secondary to proximity to anal verge  Plan:     1. Perianal abscess [K61.0] Discussed surgical excision.  Alternatives include continued observation.  Benefits include possible symptom relief, pathologic evaluation, improved cosmesis. Discussed the risk of surgery including recurrence, chronic pain, post-op infxn, poor cosmesis, poor/delayed wound healing, and possible re-operation to address said risks. The risks of general anesthetic, if used, includes MI, CVA, sudden death or even reaction to anesthetic medications also discussed.  Typical post-op recovery time of 3-5 days with possible activity restrictions were also discussed.  The patient verbalized understanding and all questions were answered to the patient's satisfaction.  2. Patient has  elected to proceed with surgical treatment. Procedure will be scheduled. EUA, possible seton placement if needed.   labs/images/medications/previous chart entries reviewed personally and relevant changes/updates noted above.

## 2023-03-31 ENCOUNTER — Other Ambulatory Visit: Payer: Self-pay

## 2023-03-31 ENCOUNTER — Encounter
Admission: RE | Admit: 2023-03-31 | Discharge: 2023-03-31 | Disposition: A | Discharge status: Home / Self Care | Payer: BC Managed Care – PPO | Source: Ambulatory Visit | Attending: Surgery | Admitting: Surgery

## 2023-03-31 VITALS — Ht 62.0 in | Wt 126.0 lb

## 2023-03-31 DIAGNOSIS — Z01812 Encounter for preprocedural laboratory examination: Secondary | ICD-10-CM

## 2023-03-31 DIAGNOSIS — E1065 Type 1 diabetes mellitus with hyperglycemia: Secondary | ICD-10-CM

## 2023-03-31 NOTE — Patient Instructions (Addendum)
 Your procedure is scheduled on:  Thursday , February 13  Report to the Registration Desk on the 1st floor of the [facility] To find out your arrival time, please call [phone] between 1PM - 3PM on:  Wednesday,  February 12 If your arrival time is 6:00 am, do not arrive before that time as the [facility] entrance doors do not open until 6:00 am.  REMEMBER: Instructions that are not followed completely may result in serious medical risk, up to and including death; or upon the discretion of your surgeon and anesthesiologist your surgery may need to be rescheduled.  Do not eat food after midnight the night before surgery.  No gum chewing or hard candies.  You may however, drink sips of WATER  up to 2 hours before you are scheduled to arrive for your surgery. Do not drink anything within 2 hours of your scheduled arrival time.    One week prior to surgery: Thursday,  February 6  Stop Anti-inflammatories (NSAIDS) such as Advil, Aleve, Ibuprofen, Motrin, Naproxen, Naprosyn and Aspirin based products such as Excedrin, Goody's Powder, BC Powder.  Stop ANY OVER THE COUNTER supplements until after surgery. vitamin C (ASCORBIC ACID)   You may however, continue to take Tylenol  if needed for pain up until the day of surgery.  **Follow guidelines for insulin  and diabetes medications.**   the normal dose of insulin  the night prior to surgery (LANTUS  SOLOSTAR 11 units the night before surgery)  NO INSULIN  MORNING OF SURGERY ( insulin  lispro (HUMALOG ) )  **Follow recommendations regarding stopping blood thinners.**  Continue taking all of your other prescription medications up until the day of surgery.  ON THE DAY OF SURGERY DO NOT TAKE ANY MEDICATIONS.  No Alcohol for 24 hours before or after surgery.  No Smoking including e-cigarettes for 24 hours before surgery.  No chewable tobacco products for at least 6 hours before surgery.  No nicotine patches on the day of surgery.  Do not use  any recreational drugs for at least a week (preferably 2 weeks) before your surgery.  Please be advised that the combination of cocaine and anesthesia may have negative outcomes, up to and including death. If you test positive for cocaine, your surgery will be cancelled.  On the morning of surgery brush your teeth with toothpaste and water , you may rinse your mouth with mouthwash if you wish. Do not swallow any toothpaste or mouthwash.  Do not wear jewelry, make-up, hairpins, clips or nail polish.  For welded (permanent) jewelry: bracelets, anklets, waist bands, etc.  Please have this removed prior to surgery.  If it is not removed, there is a chance that hospital personnel will need to cut it off on the day of surgery.  Do not wear lotions, powders, or perfumes.   Do not shave body hair from the neck down 48 hours before surgery.  Do not bring valuables to the hospital. [facility] is not responsible for any missing/lost belongings or valuables.   Notify your doctor if there is any change in your medical condition (cold, fever, infection).  Wear comfortable clothing (specific to your surgery type) to the hospital.  After surgery, you can help prevent lung complications by doing breathing exercises.  Take deep breaths and cough every 1-2 hours.   If you are being discharged the day of surgery, you will not be allowed to drive home. You will need a responsible individual to drive you home and stay with you for 24 hours after surgery.  If you are taking public transportation, you will need to have a responsible individual with you.  Please call the Pre-admissions Testing Dept. at [phone] if you have any questions about these instructions.  Surgery Visitation Policy:  Patients having surgery or a procedure may have two visitors.  Children under the age of 1 must have an adult with them who is not the patient.  Temporary Visitor Restrictions Due to increasing cases of flu,  RSV and COVID-19: Children ages 77 and under will not be able to visit patients in [facility] hospitals under most circumstances.

## 2023-04-01 ENCOUNTER — Inpatient Hospital Stay: Admission: RE | Admit: 2023-04-01 | Payer: BC Managed Care – PPO | Source: Ambulatory Visit

## 2023-04-02 ENCOUNTER — Encounter: Payer: Self-pay | Admitting: Urgent Care

## 2023-04-02 ENCOUNTER — Encounter
Admission: RE | Admit: 2023-04-02 | Discharge: 2023-04-02 | Disposition: A | Discharge status: Home / Self Care | Payer: BC Managed Care – PPO | Source: Ambulatory Visit | Attending: Surgery | Admitting: Surgery

## 2023-04-02 DIAGNOSIS — E1065 Type 1 diabetes mellitus with hyperglycemia: Secondary | ICD-10-CM | POA: Diagnosis not present

## 2023-04-02 DIAGNOSIS — Z01810 Encounter for preprocedural cardiovascular examination: Secondary | ICD-10-CM | POA: Insufficient documentation

## 2023-04-02 DIAGNOSIS — Z01812 Encounter for preprocedural laboratory examination: Secondary | ICD-10-CM

## 2023-04-06 LAB — EXTERNAL GENERIC LAB PROCEDURE: COLOGUARD: NEGATIVE

## 2023-04-06 LAB — COLOGUARD: COLOGUARD: NEGATIVE

## 2023-04-08 ENCOUNTER — Ambulatory Visit: Admission: RE | Admit: 2023-04-08 | Payer: BC Managed Care – PPO | Source: Home / Self Care | Admitting: Surgery

## 2023-04-08 ENCOUNTER — Encounter: Admission: RE | Payer: Self-pay | Source: Home / Self Care

## 2023-04-08 SURGERY — EXAM UNDER ANESTHESIA
Anesthesia: General | Site: Rectum

## 2023-07-02 ENCOUNTER — Encounter (HOSPITAL_COMMUNITY): Payer: Self-pay

## 2023-07-09 ENCOUNTER — Encounter

## 2023-07-21 ENCOUNTER — Ambulatory Visit
Admission: RE | Admit: 2023-07-21 | Discharge: 2023-07-21 | Disposition: A | Discharge status: Home / Self Care | Source: Ambulatory Visit | Attending: Certified Nurse Midwife | Admitting: Certified Nurse Midwife

## 2023-07-21 DIAGNOSIS — Z1231 Encounter for screening mammogram for malignant neoplasm of breast: Secondary | ICD-10-CM | POA: Diagnosis present
